# Patient Record
Sex: Female | Born: 2000 | Race: White | Hispanic: No | Marital: Single | Smoking: Never smoker
Health system: Southern US, Community
[De-identification: ages and names within clinical notes are randomized; demographics above are authoritative.]

## PROBLEM LIST (undated history)

## (undated) DIAGNOSIS — R519 Headache, unspecified: Secondary | ICD-10-CM

## (undated) DIAGNOSIS — F419 Anxiety disorder, unspecified: Secondary | ICD-10-CM

## (undated) DIAGNOSIS — R51 Headache: Secondary | ICD-10-CM

## (undated) DIAGNOSIS — S060XAA Concussion with loss of consciousness status unknown, initial encounter: Secondary | ICD-10-CM

## (undated) DIAGNOSIS — S060X9A Concussion with loss of consciousness of unspecified duration, initial encounter: Secondary | ICD-10-CM

## (undated) DIAGNOSIS — H539 Unspecified visual disturbance: Secondary | ICD-10-CM

## (undated) HISTORY — PX: EYE SURGERY: SHX253

## (undated) HISTORY — PX: TONSILECTOMY, ADENOIDECTOMY, BILATERAL MYRINGOTOMY AND TUBES: SHX2538

## (undated) HISTORY — PX: MOUTH SURGERY: SHX715

---
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
## 2022-06-24 LAB — HM PAP SMEAR: HPV, high-risk: POSITIVE

## 2022-10-22 ENCOUNTER — Ambulatory Visit: Payer: Managed Care, Other (non HMO) | Admitting: Family

## 2022-10-22 ENCOUNTER — Encounter: Payer: Self-pay | Admitting: Family

## 2022-10-22 VITALS — BP 126/76 | HR 91 | Temp 98.4°F | Resp 16 | Ht 65.1 in | Wt 194.0 lb

## 2022-10-22 DIAGNOSIS — S060X0D Concussion without loss of consciousness, subsequent encounter: Secondary | ICD-10-CM

## 2022-10-22 DIAGNOSIS — F32A Depression, unspecified: Secondary | ICD-10-CM | POA: Insufficient documentation

## 2022-10-22 DIAGNOSIS — F909 Attention-deficit hyperactivity disorder, unspecified type: Secondary | ICD-10-CM | POA: Insufficient documentation

## 2022-10-22 DIAGNOSIS — R635 Abnormal weight gain: Secondary | ICD-10-CM

## 2022-10-22 DIAGNOSIS — F419 Anxiety disorder, unspecified: Secondary | ICD-10-CM | POA: Insufficient documentation

## 2022-10-22 DIAGNOSIS — Z23 Encounter for immunization: Secondary | ICD-10-CM | POA: Diagnosis not present

## 2022-10-22 DIAGNOSIS — Z113 Encounter for screening for infections with a predominantly sexual mode of transmission: Secondary | ICD-10-CM | POA: Insufficient documentation

## 2022-10-22 DIAGNOSIS — Z0000 Encounter for general adult medical examination without abnormal findings: Secondary | ICD-10-CM | POA: Diagnosis not present

## 2022-10-22 LAB — CBC WITH DIFFERENTIAL/PLATELET
Basophils Absolute: 0 10*3/uL (ref 0.0–0.1)
Basophils Relative: 0.4 % (ref 0.0–3.0)
Eosinophils Absolute: 0.1 10*3/uL (ref 0.0–0.7)
Eosinophils Relative: 1.5 % (ref 0.0–5.0)
HCT: 44.6 % (ref 36.0–46.0)
Hemoglobin: 15 g/dL (ref 12.0–15.0)
Lymphocytes Relative: 42.6 % (ref 12.0–46.0)
Lymphs Abs: 2.4 10*3/uL (ref 0.7–4.0)
MCHC: 33.6 g/dL (ref 30.0–36.0)
MCV: 90.2 fl (ref 78.0–100.0)
Monocytes Absolute: 0.6 10*3/uL (ref 0.1–1.0)
Monocytes Relative: 11 % (ref 3.0–12.0)
Neutro Abs: 2.5 10*3/uL (ref 1.4–7.7)
Neutrophils Relative %: 44.5 % (ref 43.0–77.0)
Platelets: 337 10*3/uL (ref 150.0–400.0)
RBC: 4.94 Mil/uL (ref 3.87–5.11)
RDW: 13.2 % (ref 11.5–15.5)
WBC: 5.6 10*3/uL (ref 4.0–10.5)

## 2022-10-22 LAB — COMPREHENSIVE METABOLIC PANEL
ALT: 20 U/L (ref 0–35)
AST: 20 U/L (ref 0–37)
Albumin: 4.9 g/dL (ref 3.5–5.2)
Alkaline Phosphatase: 92 U/L (ref 39–117)
BUN: 17 mg/dL (ref 6–23)
CO2: 28 mEq/L (ref 19–32)
Calcium: 9.8 mg/dL (ref 8.4–10.5)
Chloride: 101 mEq/L (ref 96–112)
Creatinine, Ser: 0.96 mg/dL (ref 0.40–1.20)
GFR: 84.66 mL/min (ref >60.00)
Glucose, Bld: 87 mg/dL (ref 70–99)
Potassium: 3.8 mEq/L (ref 3.5–5.1)
Sodium: 138 mEq/L (ref 135–145)
Total Bilirubin: 0.4 mg/dL (ref 0.2–1.2)
Total Protein: 8.1 g/dL (ref 6.0–8.3)

## 2022-10-22 LAB — TSH: TSH: 3.54 u[IU]/mL (ref 0.35–5.50)

## 2022-10-22 LAB — LIPID PANEL
Cholesterol: 127 mg/dL (ref 0–200)
HDL: 57.1 mg/dL (ref >39.00)
LDL Cholesterol: 57 mg/dL (ref 0–99)
NonHDL: 69.87
Total CHOL/HDL Ratio: 2
Triglycerides: 63 mg/dL (ref 0.0–149.0)
VLDL: 12.6 mg/dL (ref 0.0–40.0)

## 2022-10-22 NOTE — Assessment & Plan Note (Signed)
She is followed by [facility].  Adderall caused anorexia.  She feels like her focus is "OK" currently on Wellbutrin.  She will follow back with them in 3 months.

## 2022-10-22 NOTE — Assessment & Plan Note (Addendum)
Discussed healthy diet, exercise and weight loss.  Vision/dental up to date. Pap is up to date. Flu shot today. Encouraged her to get her covid booster at her pharmacy.  Immunizations reviewed in [facility] and are up to date otherwise.

## 2022-10-22 NOTE — Assessment & Plan Note (Addendum)
Wt Readings from Last 3 Encounters:  10/22/22 194 lb (88 kg)  06/29/19 142 lb (64.4 kg) (77 %, Z= 0.75)*  06/08/19 143 lb (64.9 kg) (78 %, Z= 0.79)*   * Growth percentiles are based on CDC (Girls, 2-20 Years) data.   She took her last dose of sertraline last week. She was on sertraline for about 1 year.  She believes that she weight was about 160 a year ago. We discussed meal prepping, limiting fast food, regular exercise. Obtain TSH.

## 2022-10-22 NOTE — Assessment & Plan Note (Signed)
>>  ASSESSMENT AND PLAN FOR ANXIETY WRITTEN ON 10/22/2022 11:25 AM BY O'SULLIVAN, Dolan Xia, NP  Currently stable off of sertraline. Will continue to monitor

## 2022-10-22 NOTE — Progress Notes (Signed)
Subjective:   By signing my name below, I, Anne [name], attest that this documentation has been prepared under the direction and in the presence of Anne Alar, NP. 10/22/2022   Patient ID: Anne [name], female    DOB: 01-Mar-2001, 22 y.o.   MRN: [id number]  Chief Complaint  Patient presents with   New Patient (Initial Visit)    HPI Patient is in today for a new patient visit.   Allergies: She has congestion from her allergies at this time.   Weight: She has gained weight his past year. She has stopped taking sertraline last week due to noticing she was gaining weight once she started taking it. She has been taking sertraline for the past year. She weighed around 150-160 last year prior to taking sertraline. She is also in college and is eating more fast food frequently. She also notes her weight gain may be from recently starting a new healthy relationship.  Wt Readings from Last 3 Encounters:  10/22/22 194 lb (88 kg)  06/29/19 142 lb (64.4 kg) (77 %, Z= 0.75)*  06/08/19 143 lb (64.9 kg) (78 %, Z= 0.79)*   * Growth percentiles are based on CDC (Girls, 2-20 Years) data.   Exercise: She was participating in regular exercise by doing a burn boot camp program from May 2023-October 2023 but noticed she was gaining weight during that time.  Anxiety: Her anxiety is stable at this time.   Wellbutrin: She was recently started on Wellbutrin by her ADHD specialist. She was on Adderall in he past but stopped due to decreased appetite. She has a follow up appointment with her specialist in 3 months.   UTI: She recently developed a UTI back to back and seen her GYN specialist to manage it. She typically gets UTI after intercourse.   Acute: She denies having any unexpected weight change, ear pain, hearing loss and rhinorrhea, visual disturbance, cough, chest pain and leg swelling, nausea, vomiting, diarrhea, constipation, blood in stool, or dysuria and frequency, for myalgias and  arthralgias, rash, headaches, adenopathy, current concerns of depression or anxiety at this time.  Social history: She graduated last week. She is planning on starting work soon. She does not use alcohol or drugs. She is on birthcontrol pills and has one female partner. She does not use tobacco or vaping products.   Medical history: She has two concussions in the past that were caused while playing softball. She had no long term effects from either concussion. She is allergic to sulfa.   Surgical history: She had her tonsils removed. 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 She had her wisdom teeth removed around age 38.   Family history: Her other has a history of seizures. Her paternal mother has a history of diabetes. Her paternal grandfather passed from alzheimer's and dementia.   Immunizations: She does not have the flu vaccine and is interested in receiving the flu vaccine. She does not have any Covid-19 booster vaccine.   Dental: She is UTD on dental care.   Pap smear: She is UTD on pap smear and receives them regularly at her GYN office.    Past Medical History:  Diagnosis Date   Anxiety    Concussion    11/2017,  2017   Headache    Strain of hip flexor 06/08/2019   Vision abnormalities     Past Surgical History:  Procedure Laterality Date   EYE SURGERY     MOUTH SURGERY  wisdom tooth extraction age 10   STRABISMUS SURGERY Right 10/08/2018   Procedure: RIGHT EYE STRABISMUS REPAIR PEDIATRIC;  Surgeon: Everitt Amber, MD;  Location: [place];  Service: Ophthalmology;  Laterality: Right;   TONSILECTOMY, ADENOIDECTOMY, BILATERAL MYRINGOTOMY AND TUBES      Family History  Problem Relation Age of Onset   Epilepsy Mother    Diabetes Paternal Grandmother    Alzheimer's disease Paternal Grandfather    Sudden death Neg Hx    Hypertension Neg Hx    Hyperlipidemia Neg Hx    Heart attack Neg Hx     Social History   Socioeconomic History    Marital status: Single    Spouse name: Not on file   Number of children: Not on file   Years of education: Not on file   Highest education level: Not on file  Occupational History   Not on file  Tobacco Use   Smoking status: Never   Smokeless tobacco: Never  Vaping Use   Vaping Use: Never used  Substance and Sexual Activity   Alcohol use: No    Alcohol/week: 0.0 standard drinks of alcohol   Drug use: No   Sexual activity: Yes    Partners: Male    Birth control/protection: Pill  Other Topics Concern   Not on file  Social History Narrative   Graduated from [employer], 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   She has a cat and a dog.    Social Determinants of Health   Financial Resource Strain: Not on file  Food Insecurity: Not on file  Transportation Needs: Not on file  Physical Activity: Sufficiently Active (10/22/2022)   Exercise Vital Sign    Days of Exercise per Week: 4 days    Minutes of Exercise per Session: 50 min  Stress: Not on file  Social Connections: Not on file  Intimate Partner Violence: Not on file    Outpatient Medications Prior to Visit  Medication Sig Dispense Refill   buPROPion (WELLBUTRIN XL) 150 MG 24 hr tablet Take 150 mg by mouth daily.     Levonorgestrel-Ethinyl Estradiol (AMETHIA,CAMRESE) 0.1-0.02 & 0.01 MG tablet Take 1 tablet by mouth daily.  3   No facility-administered medications prior to visit.    Allergies  Allergen Reactions   Sulfa Antibiotics Hives and Rash    ROS See HPI    Objective:    Physical Exam Constitutional:      General: She is not in acute distress.    Appearance: Normal appearance. She is not ill-appearing.  HENT:     Head: Normocephalic and atraumatic.     Right Ear: Tympanic membrane, ear canal and external ear normal.     Left Ear: Tympanic membrane, ear canal and external ear normal.  Eyes:     Extraocular Movements:  Extraocular movements intact.     Right eye: No nystagmus.     Left eye: No nystagmus.     Pupils: Pupils are equal, round, and reactive to light.  Neck:     Thyroid: No thyroid mass, thyromegaly or thyroid tenderness.  Cardiovascular:     Rate and Rhythm: Normal rate and regular rhythm.     Heart sounds: Normal heart sounds. No murmur heard.    No gallop.  Pulmonary:     Effort: Pulmonary effort is normal. No respiratory distress.     Breath sounds: Normal breath sounds.  No wheezing or rales.  Abdominal:     General: Bowel sounds are normal. There is no distension.     Palpations: Abdomen is soft.     Tenderness: There is no abdominal tenderness. There is no guarding.  Musculoskeletal:     Comments: 5/5 strength in both upper and lower extremities  Lymphadenopathy:     Cervical: No cervical adenopathy.  Skin:    General: Skin is warm and dry.  Neurological:     Mental Status: She is alert and oriented to person, place, and time.     Deep Tendon Reflexes:     Reflex Scores:      Patellar reflexes are 2+ on the right side and 2+ on the left side. Psychiatric:        Judgment: Judgment normal.     BP 126/76 (BP Location: Right Arm, Patient Position: Sitting, Cuff Size: Small)   Pulse 91   Temp 98.4 F (36.9 C) (Oral)   Resp 16   Ht 5' 5.1" (1.654 m)   Wt 194 lb (88 kg)   SpO2 99%   BMI 32.18 kg/m  Wt Readings from Last 3 Encounters:  10/22/22 194 lb (88 kg)  06/29/19 142 lb (64.4 kg) (77 %, Z= 0.75)*  06/08/19 143 lb (64.9 kg) (78 %, Z= 0.79)*   * Growth percentiles are based on CDC (Girls, 2-20 Years) data.       Assessment & Plan:  Preventative health care Assessment & Plan: Discussed healthy diet, exercise and weight loss.  Vision/dental up to date. Pap is up to date. Flu shot today. Encouraged her to get her covid booster at her pharmacy.  Immunizations reviewed in [facility] and are up to date otherwise.   Orders: -     Comprehensive metabolic panel -     CBC  with Differential/Platelet -     Lipid panel  Weight gain Assessment & Plan: Wt Readings from Last 3 Encounters:  10/22/22 194 lb (88 kg)  06/29/19 142 lb (64.4 kg) (77 %, Z= 0.75)*  06/08/19 143 lb (64.9 kg) (78 %, Z= 0.79)*   * Growth percentiles are based on CDC (Girls, 2-20 Years) data.   She took her last dose of sertraline last week. She was on sertraline for about 1 year.  She believes that she weight was about 160 a year ago. We discussed meal prepping, limiting fast food, regular exercise. Obtain TSH.     Orders: -     TSH  Anxiety Assessment & Plan: Currently stable off of sertraline. Will continue to monitor    Attention deficit hyperactivity disorder (ADHD), unspecified ADHD type Assessment & Plan: She is followed by [facility].  Adderall caused anorexia.  She feels like her focus is "OK" currently on Wellbutrin.  She will follow back with them in 3 months.     Concussion without loss of consciousness, subsequent encounter Assessment & Plan: Reports no residual issues from previous concussions.    Needs flu shot -     Flu Vaccine QUAD 10mo+IM (Fluarix, Fluzone & Alfiuria Quad PF)    I, Nance Pear, NP, personally preformed the services described in this documentation.  All medical record entries made by the scribe were at my direction and in my presence.  I have reviewed the chart and discharge instructions (if applicable) and agree that the record reflects my personal performance and is accurate and complete. 10/22/2022   I,Anne [name],acting as a scribe for Nance Pear, NP.,have  documented all relevant documentation on the behalf of Nance Pear, NP,as directed by  Nance Pear, NP while in the presence of Nance Pear, NP.   Nance Pear, NP

## 2022-10-22 NOTE — Assessment & Plan Note (Signed)
Reports no residual issues from previous concussions.

## 2022-10-22 NOTE — Assessment & Plan Note (Signed)
Currently stable off of sertraline. Will continue to monitor

## 2022-11-05 ENCOUNTER — Encounter: Payer: Self-pay | Admitting: Family

## 2022-11-07 MED ORDER — ESCITALOPRAM OXALATE 10 MG PO TABS: ORAL_TABLET | ORAL | 0 refills | Status: DC

## 2022-11-30 ENCOUNTER — Other Ambulatory Visit: Payer: Self-pay | Admitting: Family

## 2022-12-22 ENCOUNTER — Encounter: Payer: Self-pay | Admitting: Family

## 2022-12-22 ENCOUNTER — Ambulatory Visit: Payer: Managed Care, Other (non HMO) | Admitting: Family

## 2022-12-22 VITALS — BP 102/51 | HR 80 | Temp 97.7°F | Resp 16 | Wt 199.0 lb

## 2022-12-22 DIAGNOSIS — B001 Herpesviral vesicular dermatitis: Secondary | ICD-10-CM | POA: Diagnosis not present

## 2022-12-22 DIAGNOSIS — F909 Attention-deficit hyperactivity disorder, unspecified type: Secondary | ICD-10-CM

## 2022-12-22 DIAGNOSIS — F419 Anxiety disorder, unspecified: Secondary | ICD-10-CM | POA: Diagnosis not present

## 2022-12-22 DIAGNOSIS — F32A Depression, unspecified: Secondary | ICD-10-CM | POA: Diagnosis not present

## 2022-12-22 MED ORDER — VALACYCLOVIR HCL 500 MG PO TABS: 500.0000 mg | ORAL_TABLET | Freq: Every day | ORAL | 1 refills | Status: DC

## 2022-12-22 NOTE — Assessment & Plan Note (Signed)
Uncontrolled.  She is definitely having some situational stress living at home with her mother who is "very negative."  She plans to move out in the next few months. I think that a new environment will be helpful for her.  I have advised her to schedule an appointment with a counselor and also recommended that she schedule an appointment with psychiatry for med management. She has not had significant improvement in her symptoms on sertraline, wellbutrin or lexapro.  We discussed if worsening symptoms to go to the ER.

## 2022-12-22 NOTE — Progress Notes (Signed)
Subjective:   By signing my name below, I, Anne [name], attest that this documentation has been prepared under the direction and in the presence of Anne Craze, NP. 12/22/2022   Patient ID: Anne [name], female    DOB: 2001-08-03, 22 y.o.   MRN: [id number]  Chief Complaint  Patient presents with   Anxiety    Follow up and discuss medication     HPI Patient is in today for an office visit.   Lexapro: She took 10 mg Lexapro for 3 weeks, but did not find relief with it. She was having difficulty with thinking of the worst outcome to situations while on it. She has not been taking it for the past week. After stopping Lexapro, she feels as though she no longer enjoys her normal activities as much and stresses  out over problems that are not hers. She also lives at home and finds it difficult for her mood because her mother is a source of stress. She plans to move out in the next few months. Denies SI.   Wellbutrin: She has not been taking Wellbutrin for the past month. She reports that her depression was no better while taking it. This was being rx'd by [facility] for her [facility].   Counseling: She is interested in counseling.  Blister: She received a blister from the sun recently on her lip and is interested in starting medicine to prevent them from popping up in the future. Reports that they are recurrent in nature.     Past Medical History:  Diagnosis Date   Anxiety    Concussion    11/2017,  2017   Headache    Strain of hip flexor 06/08/2019   Vision abnormalities     Past Surgical History:  Procedure Laterality Date   EYE SURGERY     MOUTH SURGERY     wisdom tooth extraction age 21   STRABISMUS SURGERY Right 10/08/2018   Procedure: RIGHT EYE STRABISMUS REPAIR PEDIATRIC;  Surgeon: Anne Carrow, MD;  Location: [facility];  Service: Ophthalmology;  Laterality: Right;   TONSILECTOMY, ADENOIDECTOMY, BILATERAL MYRINGOTOMY AND TUBES       Family History  Problem Relation Age of Onset   Epilepsy Mother    Diabetes Paternal Grandmother    Alzheimer's disease Paternal Grandfather    Sudden death Neg Hx    Hypertension Neg Hx    Hyperlipidemia Neg Hx    Heart attack Neg Hx     Social History   Socioeconomic History   Marital status: Single    Spouse name: Not on file   Number of children: Not on file   Years of education: Not on file   Highest education level: Not on file  Occupational History   Not on file  Tobacco Use   Smoking status: Never   Smokeless tobacco: Never  Vaping Use   Vaping Use: Never used  Substance and Sexual Activity   Alcohol use: No    Alcohol/week: 0.0 standard drinks of alcohol   Drug use: No   Sexual activity: Yes    Partners: Male    Birth control/protection: Pill  Other Topics Concern   Not on file  Social History Narrative   Graduated from [employer], she is job Pharmacist, 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   She has a cat and a dog.    Social Determinants of Health   Financial  Resource Strain: Not on file  Food Insecurity: Not on file  Transportation Needs: Not on file  Physical Activity: Sufficiently Active (10/22/2022)   Exercise Vital Sign    Days of Exercise per Week: 4 days    Minutes of Exercise per Session: 50 min  Stress: Not on file  Social Connections: Not on file  Intimate Partner Violence: Not on file    Outpatient Medications Prior to Visit  Medication Sig Dispense Refill   Levonorgestrel-Ethinyl Estradiol (AMETHIA,CAMRESE) 0.1-0.02 & 0.01 MG tablet Take 1 tablet by mouth daily.  3   buPROPion (WELLBUTRIN XL) 150 MG 24 hr tablet Take 150 mg by mouth daily.     escitalopram (LEXAPRO) 10 MG tablet Take 1 tablet by mouth daily. (Patient not taking: Reported on 12/22/2022) 90 tablet 1   No facility-administered medications prior to visit.    Allergies  Allergen Reactions   Sulfa  Antibiotics Hives and Rash    ROS See HPI      Objective:    Physical Exam Constitutional:      General: She is not in acute distress.    Appearance: Normal appearance.  HENT:     Head: Normocephalic and atraumatic.     Comments: Cold sore right lower right lip    Right Ear: External ear normal.     Left Ear: External ear normal.  Eyes:     Extraocular Movements: Extraocular movements intact.     Pupils: Pupils are equal, round, and reactive to light.  Cardiovascular:     Rate and Rhythm: Normal rate.  Pulmonary:     Effort: Pulmonary effort is normal.  Skin:    General: Skin is warm.  Neurological:     Mental Status: She is alert and oriented to person, place, and time.  Psychiatric:        Attention and Perception: Attention normal.        Mood and Affect: Affect normal.        Speech: Speech normal.        Behavior: Behavior normal. Behavior is cooperative.        Thought Content: Thought content normal.        Judgment: Judgment normal.     Comments: Mildly anxious     BP (!) 102/51 (BP Location: Right Arm, Patient Position: Sitting, Cuff Size: Large)   Pulse 80   Temp 97.7 F (36.5 C) (Oral)   Resp 16   Wt 199 lb (90.3 kg)   SpO2 100%   BMI 33.01 kg/m  Wt Readings from Last 3 Encounters:  12/22/22 199 lb (90.3 kg)  10/22/22 194 lb (88 kg)  06/29/19 142 lb (64.4 kg) (77 %, Z= 0.75)*   * Growth percentiles are based on CDC (Girls, 2-20 Years) data.       Assessment & Plan:  Anxiety and depression Assessment & Plan: Uncontrolled.  She is definitely having some situational stress living at home with her mother who is "very negative."  She plans to move out in the next few months. I think that a new environment will be helpful for her.  I have advised her to schedule an appointment with a counselor and also recommended that she schedule an appointment with psychiatry for med management. She has not had significant improvement in her symptoms on sertraline,  wellbutrin or lexapro.  We discussed if worsening symptoms to go to the ER.     Attention deficit hyperactivity disorder (ADHD), unspecified ADHD type Assessment & Plan: Uncontrolled.  Following at [facility].  Perhaps her new psychiatrist could further manage her ADHD.    Recurrent cold sores Assessment & Plan: Uncontrolled. Will initiate valtrex suppressive therapy 500mg  once daily.    Other orders -     valACYclovir HCl; Take 1 tablet (500 mg total) by mouth daily.  Dispense: 90 tablet; Refill: 1    I, Lemont Fillers, NP, personally preformed the services described in this documentation.  All medical record entries made by the scribe were at my direction and in my presence.  I have reviewed the chart and discharge instructions (if applicable) and agree that the record reflects my personal performance and is accurate and complete. 12/22/2022  Lemont Fillers, NP   [facility] as a scribe for Lemont Fillers, NP.,have documented all relevant documentation on the behalf of Lemont Fillers, NP,as directed by  Lemont Fillers, NP while in the presence of Lemont Fillers, NP.

## 2022-12-22 NOTE — Patient Instructions (Addendum)
Psychiatric Services:  [place] [facility] [address] [place] [address]  [phone] [facility] [place]) - [phone] Dr. Milagros Evener [place]) [phone] [facility] [place]) [phone] [facility] & [place]) [phone]

## 2022-12-22 NOTE — Assessment & Plan Note (Signed)
Uncontrolled. Will initiate valtrex suppressive therapy 500mg  once daily.

## 2022-12-22 NOTE — Assessment & Plan Note (Signed)
Uncontrolled.  Following at [facility].  Perhaps her new psychiatrist could further manage her ADHD.

## 2022-12-24 ENCOUNTER — Other Ambulatory Visit: Payer: Self-pay | Admitting: Family

## 2022-12-24 MED ORDER — DULOXETINE HCL 60 MG PO CPEP: 60.0000 mg | ORAL_CAPSULE | Freq: Every day | ORAL | 0 refills | Status: DC

## 2022-12-24 MED ORDER — DULOXETINE HCL 30 MG PO CPEP: ORAL_CAPSULE | ORAL | 0 refills | Status: DC

## 2022-12-29 ENCOUNTER — Encounter: Payer: Self-pay | Admitting: *Deleted

## 2022-12-29 ENCOUNTER — Ambulatory Visit: Payer: Managed Care, Other (non HMO) | Admitting: Family

## 2023-01-01 ENCOUNTER — Telehealth: Payer: Managed Care, Other (non HMO)

## 2023-01-03 ENCOUNTER — Encounter: Payer: Managed Care, Other (non HMO) | Admitting: Nurse Practitioner

## 2023-01-03 DIAGNOSIS — R399 Unspecified symptoms and signs involving the genitourinary system: Secondary | ICD-10-CM

## 2023-01-03 NOTE — Progress Notes (Signed)
Signed on at 848am prior to her appt. I was with another video visit at that time. She left before 9am and did not log back in.

## 2023-01-04 ENCOUNTER — Encounter: Payer: Self-pay | Admitting: Family

## 2023-01-05 NOTE — Progress Notes (Signed)
Subjective:   By signing my name below, I, Carlena Bjornstad, attest that this documentation has been prepared under the direction and in the presence of [name], NP.  01/06/2023.   Patient ID: Anne [name], female    DOB: 2000/12/03, 22 y.o.   MRN: [id number]  No chief complaint on file.   HPI Patient is in today for an office visit.  UTI:  On 01/04/23 she messaged the office with concerns for a UTI. For treatment she had been trying to take AZO which seems to be ineffective. Today, she complains of burning sensations with urination, urinary frequency, and urgency. Onset of her symptoms was last week, about Wednesday or Thursday.    Past Medical History:  Diagnosis Date   Anxiety    Concussion    11/2017,  2017   Headache    Strain of hip flexor 06/08/2019   Vision abnormalities     Past Surgical History:  Procedure Laterality Date   EYE SURGERY     MOUTH SURGERY     wisdom tooth extraction age 37   STRABISMUS SURGERY Right 10/08/2018   Procedure: RIGHT EYE STRABISMUS REPAIR PEDIATRIC;  Surgeon: Verne Carrow, MD;  Location: [facility];  Service: Ophthalmology;  Laterality: Right;   TONSILECTOMY, ADENOIDECTOMY, BILATERAL MYRINGOTOMY AND TUBES      Family History  Problem Relation Age of Onset   Epilepsy Mother    Diabetes Paternal Grandmother    Alzheimer's disease Paternal Grandfather    Sudden death Neg Hx    Hypertension Neg Hx    Hyperlipidemia Neg Hx    Heart attack Neg Hx     Social History   Socioeconomic History   Marital status: Single    Spouse name: Not on file   Number of children: Not on file   Years of education: Not on file   Highest education level: Not on file  Occupational History   Not on file  Tobacco Use   Smoking status: Never   Smokeless tobacco: Never  Vaping Use   Vaping Use: Never used  Substance and Sexual Activity   Alcohol use: No    Alcohol/week: 0.0 standard drinks of alcohol   Drug use: No    Sexual activity: Yes    Partners: Male    Birth control/protection: Pill  Other Topics Concern   Not on file  Social History Narrative   Graduated from [employer], she is job Pharmacist, 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   She has a cat and a dog.    Social Determinants of Health   Financial Resource Strain: Not on file  Food Insecurity: Not on file  Transportation Needs: Not on file  Physical Activity: Sufficiently Active (10/22/2022)   Exercise Vital Sign    Days of Exercise per Week: 4 days    Minutes of Exercise per Session: 50 min  Stress: Not on file  Social Connections: Not on file  Intimate Partner Violence: Not on file    Outpatient Medications Prior to Visit  Medication Sig Dispense Refill   DULoxetine (CYMBALTA) 30 MG capsule TAKE 1 CAPSULE BY MOUTH DAILY FOR 1 WEEK, THEN INCREASE TO a 60mg  tab once weekly. 7 capsule 0   DULoxetine (CYMBALTA) 60 MG capsule Take 1 capsule (60 mg total) by mouth daily. 30 capsule 0   Levonorgestrel-Ethinyl Estradiol (AMETHIA,CAMRESE) 0.1-0.02 & 0.01 MG tablet Take 1 tablet by mouth daily.  3   valACYclovir (VALTREX) 500 MG tablet Take 1 tablet (500 mg total) by mouth daily. 90 tablet 1   No facility-administered medications prior to visit.    Allergies  Allergen Reactions   Sulfa Antibiotics Hives and Rash    Review of Systems  Genitourinary:  Positive for dysuria, frequency and urgency. Negative for flank pain and hematuria.    See HPI.     Objective:    Physical Exam Constitutional:      Appearance: Normal appearance.  HENT:     Head: Normocephalic and atraumatic.     Right Ear: Tympanic membrane, ear canal and external ear normal.     Left Ear: Tympanic membrane, ear canal and external ear normal.  Eyes:     Extraocular Movements: Extraocular movements intact.     Pupils: Pupils are equal, round, and reactive to light.  Cardiovascular:     Rate and  Rhythm: Normal rate and regular rhythm.     Heart sounds: Normal heart sounds. No murmur heard.    No gallop.  Pulmonary:     Effort: Pulmonary effort is normal. No respiratory distress.     Breath sounds: Normal breath sounds. No wheezing or rales.  Abdominal:     Tenderness: There is abdominal tenderness in the suprapubic area. There is no right CVA tenderness or left CVA tenderness.  Skin:    General: Skin is warm and dry.  Neurological:     General: No focal deficit present.     Mental Status: She is alert and oriented to person, place, and time.  Psychiatric:        Mood and Affect: Mood normal.        Behavior: Behavior normal.     BP (!) 112/56 (BP Location: Left Arm, Patient Position: Sitting, Cuff Size: Large)   Pulse 79   Temp 98 F (36.7 C) (Oral)   Resp 16   Wt 196 lb (88.9 kg)   SpO2 100%   BMI 32.52 kg/m  Wt Readings from Last 3 Encounters:  01/06/23 196 lb (88.9 kg)  12/22/22 199 lb (90.3 kg)  10/22/22 194 lb (88 kg)      Assessment & Plan:   Problem List Items Addressed This Visit       Unprioritized   UTI (urinary tract infection)    New. Will send for culture and begin treatment with macrobid. Pt is advised to call if symptoms worsen or if not improved in 2-3 days.      Relevant Medications   nitrofurantoin, macrocrystal-monohydrate, (MACROBID) 100 MG capsule   Other Visit Diagnoses     Dysuria    -  Primary   Relevant Orders   POCT Urinalysis Dipstick (Automated) (Completed)   Urine Culture        Meds ordered this encounter  Medications   nitrofurantoin, macrocrystal-monohydrate, (MACROBID) 100 MG capsule    Sig: Take 1 capsule (100 mg total) by mouth 2 (two) times daily for 5 days.    Dispense:  10 capsule    Refill:  0    Order Specific Question:   Supervising Provider    Answer:   Danise Edge A [[id number]]    I, Lemont Fillers, NP, personally preformed the services described in this documentation.  All medical record  entries made by the scribe were at my direction and in my presence.  I have reviewed the chart and discharge instructions (if applicable) and agree that the record reflects my personal performance and  is accurate and complete. 01/06/2023.  I,Mathew Stumpf,acting as a Neurosurgeon for [employer], NP.,have documented all relevant documentation on the behalf of Lemont Fillers, NP,as directed by  Lemont Fillers, NP while in the presence of Lemont Fillers, NP.   Lemont Fillers, NP

## 2023-01-06 ENCOUNTER — Ambulatory Visit: Payer: Managed Care, Other (non HMO) | Admitting: Family

## 2023-01-06 ENCOUNTER — Other Ambulatory Visit (HOSPITAL_BASED_OUTPATIENT_CLINIC_OR_DEPARTMENT_OTHER): Payer: Self-pay

## 2023-01-06 VITALS — BP 112/56 | HR 79 | Temp 98.0°F | Resp 16 | Wt 196.0 lb

## 2023-01-06 DIAGNOSIS — R300 Dysuria: Secondary | ICD-10-CM

## 2023-01-06 DIAGNOSIS — N390 Urinary tract infection, site not specified: Secondary | ICD-10-CM | POA: Insufficient documentation

## 2023-01-06 DIAGNOSIS — N3000 Acute cystitis without hematuria: Secondary | ICD-10-CM

## 2023-01-06 LAB — POC URINALSYSI DIPSTICK (AUTOMATED)
Bilirubin, UA: NEGATIVE
Blood, UA: NEGATIVE
Glucose, UA: NEGATIVE
Ketones, UA: NEGATIVE
Nitrite, UA: NEGATIVE
Protein, UA: NEGATIVE
Spec Grav, UA: 1.015 (ref 1.010–1.025)
Urobilinogen, UA: 0.2 E.U./dL
pH, UA: 7 (ref 5.0–8.0)

## 2023-01-06 MED ORDER — NITROFURANTOIN MONOHYD MACRO 100 MG PO CAPS
100.0000 mg | ORAL_CAPSULE | Freq: Two times a day (BID) | ORAL | 0 refills | Status: AC
  Filled 2023-01-06: qty 10, 5d supply, fill #0

## 2023-01-06 NOTE — Assessment & Plan Note (Signed)
New. Will send for culture and begin treatment with macrobid. Pt is advised to call if symptoms worsen or if not improved in 2-3 days.

## 2023-01-07 LAB — URINE CULTURE
MICRO NUMBER:: 14861857
SPECIMEN QUALITY:: ADEQUATE

## 2023-01-08 ENCOUNTER — Telehealth: Payer: Self-pay | Admitting: Family

## 2023-01-08 NOTE — Telephone Encounter (Signed)
See mychart.  

## 2023-01-12 ENCOUNTER — Ambulatory Visit: Payer: Managed Care, Other (non HMO) | Admitting: Family

## 2023-01-12 ENCOUNTER — Other Ambulatory Visit (HOSPITAL_COMMUNITY)
Admission: RE | Admit: 2023-01-12 | Discharge: 2023-01-12 | Disposition: A | Discharge status: Home / Self Care | Payer: Managed Care, Other (non HMO) | Source: Ambulatory Visit | Attending: Family | Admitting: Family

## 2023-01-12 VITALS — BP 104/59 | HR 80 | Temp 98.3°F | Resp 16 | Wt 196.0 lb

## 2023-01-12 DIAGNOSIS — R350 Frequency of micturition: Secondary | ICD-10-CM | POA: Insufficient documentation

## 2023-01-12 DIAGNOSIS — B079 Viral wart, unspecified: Secondary | ICD-10-CM

## 2023-01-12 DIAGNOSIS — N760 Acute vaginitis: Secondary | ICD-10-CM

## 2023-01-12 DIAGNOSIS — Z113 Encounter for screening for infections with a predominantly sexual mode of transmission: Secondary | ICD-10-CM

## 2023-01-12 DIAGNOSIS — B9689 Other specified bacterial agents as the cause of diseases classified elsewhere: Secondary | ICD-10-CM

## 2023-01-12 DIAGNOSIS — A630 Anogenital (venereal) warts: Secondary | ICD-10-CM | POA: Insufficient documentation

## 2023-01-12 MED ORDER — IMIQUIMOD 5 % EX CREA: TOPICAL_CREAM | CUTANEOUS | 1 refills | Status: AC

## 2023-01-12 NOTE — Assessment & Plan Note (Signed)
New. Will rx with aldara 3x a week.

## 2023-01-12 NOTE — Assessment & Plan Note (Signed)
Vaginal swab obtained. Will send for GC/Chlamydia, trichomonas, BV (at pt request).

## 2023-01-12 NOTE — Progress Notes (Addendum)
Subjective:     Patient ID: Anne [name], female    DOB: 2001-01-10, 22 y.o.   MRN: [id number]  Chief Complaint  Patient presents with   STI screening    Complains of some vaginal discharge    HPI Patient is in today for STI testing.  Last visit she complained of dysuria.  We treated her empirically with macrobid. Urine culture grew mixed genital flora.  She reported on 4/25 that her symptoms had improved some but she was still having urinary frequency. She reports that she was still having urinary frequency. We asked her to return for vaginal swab for STI testing.    She reports that she completed the full course of antibiotics since she last reached out and her urinary frequency is now resolved.    Health Maintenance Due  Topic Date Due   COVID-19 Vaccine (1) Never done   HIV Screening  Never done   Hepatitis C Screening  Never done    Past Medical History:  Diagnosis Date   Anxiety    Concussion    11/2017,  2017   Headache    Strain of hip flexor 06/08/2019   Vision abnormalities     Past Surgical History:  Procedure Laterality Date   EYE SURGERY     MOUTH SURGERY     wisdom tooth extraction age 33   STRABISMUS SURGERY Right 10/08/2018   Procedure: RIGHT EYE STRABISMUS REPAIR PEDIATRIC;  Surgeon: Verne Carrow, MD;  Location: [facility];  Service: Ophthalmology;  Laterality: Right;   TONSILECTOMY, ADENOIDECTOMY, BILATERAL MYRINGOTOMY AND TUBES      Family History  Problem Relation Age of Onset   Epilepsy Mother    Diabetes Paternal Grandmother    Alzheimer's disease Paternal Grandfather    Sudden death Neg Hx    Hypertension Neg Hx    Hyperlipidemia Neg Hx    Heart attack Neg Hx     Social History   Socioeconomic History   Marital status: Single    Spouse name: Not on file   Number of children: Not on file   Years of education: Not on file   Highest education level: Not on file  Occupational History   Not on file  Tobacco Use    Smoking status: Never   Smokeless tobacco: Never  Vaping Use   Vaping Use: Never used  Substance and Sexual Activity   Alcohol use: No    Alcohol/week: 0.0 standard drinks of alcohol   Drug use: No   Sexual activity: Yes    Partners: Male    Birth control/protection: Pill  Other Topics Concern   Not on file  Social History Narrative   Graduated from [employer], she is job Pharmacist, 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   She has a cat and a dog.    Social Determinants of Health   Financial Resource Strain: Not on file  Food Insecurity: Not on file  Transportation Needs: Not on file  Physical Activity: Sufficiently Active (10/22/2022)   Exercise Vital Sign    Days of Exercise per Week: 4 days    Minutes of Exercise per Session: 50 min  Stress: Not on file  Social Connections: Not on file  Intimate Partner Violence: Not on file    Outpatient Medications Prior to Visit  Medication Sig Dispense Refill   DULoxetine (CYMBALTA) 30 MG capsule TAKE 1 CAPSULE BY MOUTH DAILY FOR  1 WEEK, THEN INCREASE TO a 60mg  tab once weekly. 7 capsule 0   DULoxetine (CYMBALTA) 60 MG capsule Take 1 capsule (60 mg total) by mouth daily. 30 capsule 0   Levonorgestrel-Ethinyl Estradiol (AMETHIA,CAMRESE) 0.1-0.02 & 0.01 MG tablet Take 1 tablet by mouth daily.  3   valACYclovir (VALTREX) 500 MG tablet Take 1 tablet (500 mg total) by mouth daily. 90 tablet 1   No facility-administered medications prior to visit.    Allergies  Allergen Reactions   Sulfa Antibiotics Hives and Rash    ROS     Objective:    Physical Exam Constitutional:      Appearance: Normal appearance.  HENT:     Head: Normocephalic and atraumatic.  Genitourinary:    General: Normal vulva.       Comments: Several warts noted within both areas marked above.   Neurological:     Mental Status: She is alert.     BP (!) 104/59 (BP Location: Right Arm, Patient  Position: Sitting, Cuff Size: Large)   Pulse 80   Temp 98.3 F (36.8 C) (Oral)   Resp 16   Wt 196 lb (88.9 kg)   SpO2 100%   BMI 32.52 kg/m  Wt Readings from Last 3 Encounters:  01/12/23 196 lb (88.9 kg)  01/06/23 196 lb (88.9 kg)  12/22/22 199 lb (90.3 kg)       Assessment & Plan:   Problem List Items Addressed This Visit       Unprioritized   Viral warts    New. Will rx with aldara 3x a week.       Relevant Medications   imiquimod (ALDARA) 5 % cream   Urinary frequency - Primary    Resolved.        Relevant Orders   Cervicovaginal ancillary only( [place])   Screening examination for STI    Vaginal swab obtained. Will send for GC/Chlamydia, trichomonas, BV (at pt request).       Addendum: Swab + for bacterial vaginosis  I am having Anne [name] start on imiquimod. I am also having her maintain her Levonorgestrel-Ethinyl Estradiol, valACYclovir, DULoxetine, and DULoxetine.  Meds ordered this encounter  Medications   imiquimod (ALDARA) 5 % cream    Sig: Apply topically 3 (three) times a week.    Dispense:  12 each    Refill:  1    Order Specific Question:   Supervising Provider    Answer:   Danise Edge A [id number]

## 2023-01-12 NOTE — Assessment & Plan Note (Signed)
Resolved

## 2023-01-12 NOTE — Assessment & Plan Note (Signed)
New.  Recommended that she begin topical aldara cream 3x a week.

## 2023-01-13 ENCOUNTER — Other Ambulatory Visit: Payer: Self-pay | Admitting: Family

## 2023-01-13 ENCOUNTER — Telehealth: Payer: Self-pay | Admitting: Family

## 2023-01-13 LAB — CERVICOVAGINAL ANCILLARY ONLY
Bacterial Vaginitis (gardnerella): POSITIVE — AB
Chlamydia: NEGATIVE
Comment: NEGATIVE
Comment: NEGATIVE
Comment: NEGATIVE
Comment: NORMAL
Neisseria Gonorrhea: NEGATIVE
Trichomonas: NEGATIVE

## 2023-01-13 MED ORDER — METRONIDAZOLE 0.75 % VA GEL: 1.0000 | Freq: Every day | VAGINAL | 0 refills | Status: AC

## 2023-01-13 NOTE — Telephone Encounter (Signed)
Swab is negative for gonorrhea/chlamydia and trichomonas. She does have bacterial vaginosis. I will send metrogel to her pharmacy.

## 2023-01-13 NOTE — Telephone Encounter (Signed)
Patient advised of results and rx

## 2023-02-10 ENCOUNTER — Encounter: Payer: Self-pay | Admitting: Family

## 2023-02-13 ENCOUNTER — Ambulatory Visit: Payer: Managed Care, Other (non HMO) | Admitting: Family

## 2023-02-17 ENCOUNTER — Ambulatory Visit (INDEPENDENT_AMBULATORY_CARE_PROVIDER_SITE_OTHER): Payer: Managed Care, Other (non HMO) | Admitting: Sports Medicine

## 2023-02-17 ENCOUNTER — Other Ambulatory Visit: Payer: Self-pay

## 2023-02-17 ENCOUNTER — Ambulatory Visit
Admission: RE | Admit: 2023-02-17 | Discharge: 2023-02-17 | Disposition: A | Discharge status: Home / Self Care | Payer: Managed Care, Other (non HMO) | Source: Ambulatory Visit | Attending: Sports Medicine | Admitting: Sports Medicine

## 2023-02-17 VITALS — BP 120/74 | Ht 65.0 in | Wt 190.0 lb

## 2023-02-17 DIAGNOSIS — S93492A Sprain of other ligament of left ankle, initial encounter: Secondary | ICD-10-CM | POA: Diagnosis not present

## 2023-02-17 DIAGNOSIS — M25572 Pain in left ankle and joints of left foot: Secondary | ICD-10-CM

## 2023-02-17 NOTE — Progress Notes (Unsigned)
  Anne [name] - 22 y.o. female MRN [id number]  Date of birth: 11/03/2000    CHIEF COMPLAINT:   left ankle pain    SUBJECTIVE:   HPI: Anne [name] is a 22 yo F with no significant pmh who presents with left ankle pain. The patient reports inverting her ankle this morning while doing an exercise class and stepping off of a box. She says she felt a pop in the ankle followed by pain and swelling. Anne [name] says she has not been able to bear weight on the ankle since this incident and reports feeling the majority of the pain on the lateral aspect of the ankle. She was able to get X-Ray imaging of the ankle before coming into clinic today which showed no acute fracture.    ROS:     See HPI  PERTINENT  PMH / PSH FH / / SH:  Past Medical, Surgical, Social, and Family History Reviewed & Updated in the EMR.  Pertinent findings include:  none  OBJECTIVE: BP 120/74   Ht 5\' 5"  (1.651 m)   Wt 190 lb (86.2 kg)   BMI 31.62 kg/m   Physical Exam:  Vital signs are reviewed.  GEN: Alert and oriented, NAD Pulm: Breathing unlabored PSY: normal mood, congruent affect  MSK: Left ankle - soft tissue swelling present on lateral aspect of the ankle. Tender to palpation on posterior lateral malleolus and base of 5th metatarsal. Mldly tender to palpation on medial malleolus. Non tender to palpation on navicular. Anterior drawer and talar tilt test limited due to swelling.   ASSESSMENT & PLAN:  1. Left ankle Pain The most likely etiology of this patient's clinical presentation is a sprain of the left ankle. X-Ray imaging is reassuring that she didn't suffer a fracture of the foot or ankle. At her follow up visit we will benefit from repeating the anterior drawer and talar tilt test when she has less swelling in the ankle. Patient will benefit from working on range of motion early in the recovery process to help minimize swelling and pain. We also recommend that she elevate the ankle above the level of the  heart to decrease swelling.  - Will give patient a lace-up ankle brace - recommend patient use crutches, ibuprofen, elevate the leg and apply ice to the site of injury.  - Range of motion exercises were given to the patient - Follow up in 2 wks. Will consider increasing physical therapy intensity at that time.   Eustaquio Boyden, MS4

## 2023-02-19 ENCOUNTER — Encounter: Payer: Self-pay | Admitting: Sports Medicine

## 2023-02-20 ENCOUNTER — Encounter (INDEPENDENT_AMBULATORY_CARE_PROVIDER_SITE_OTHER): Payer: Managed Care, Other (non HMO) | Admitting: Family

## 2023-02-20 DIAGNOSIS — N3000 Acute cystitis without hematuria: Secondary | ICD-10-CM

## 2023-02-20 MED ORDER — NITROFURANTOIN MONOHYD MACRO 100 MG PO CAPS: 100.0000 mg | ORAL_CAPSULE | Freq: Two times a day (BID) | ORAL | 0 refills | Status: AC

## 2023-02-20 NOTE — Telephone Encounter (Signed)

## 2023-03-03 ENCOUNTER — Ambulatory Visit (INDEPENDENT_AMBULATORY_CARE_PROVIDER_SITE_OTHER): Payer: Managed Care, Other (non HMO) | Admitting: Sports Medicine

## 2023-03-03 VITALS — BP 110/70 | Ht 65.0 in | Wt 190.0 lb

## 2023-03-03 DIAGNOSIS — S93402A Sprain of unspecified ligament of left ankle, initial encounter: Secondary | ICD-10-CM | POA: Insufficient documentation

## 2023-03-03 DIAGNOSIS — S93402D Sprain of unspecified ligament of left ankle, subsequent encounter: Secondary | ICD-10-CM | POA: Diagnosis not present

## 2023-03-03 NOTE — Progress Notes (Signed)
PCP: [name], NP  Subjective:   HPI: Patient is a 22 y.o. female here for follow up of left anterior talofibular sprain.   Swelling and pain have improved somewhat. She is using lace up ankle brace, however has pain when wearing shoes with this. 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 She is a able to wear ankle brace with slides without issues. Increased pain of the lateral foot after walking about 30 minutes but otherwise able to walk short distances without issue.  Past Medical History:  Diagnosis Date   Anxiety    Concussion    11/2017,  2017   Headache    Strain of hip flexor 06/08/2019   Vision abnormalities     Current Outpatient Medications on File Prior to Visit  Medication Sig Dispense Refill   DULoxetine (CYMBALTA) 30 MG capsule TAKE 1 CAPSULE BY MOUTH DAILY FOR 1 WEEK, THEN INCREASE TO a 60mg  tab once weekly. 7 capsule 0   DULoxetine (CYMBALTA) 60 MG capsule Take 1 capsule (60 mg total) by mouth daily. 90 capsule 0   imiquimod (ALDARA) 5 % cream Apply topically 3 (three) times a week. 12 each 1   Levonorgestrel-Ethinyl Estradiol (AMETHIA,CAMRESE) 0.1-0.02 & 0.01 MG tablet Take 1 tablet by mouth daily.  3   valACYclovir (VALTREX) 500 MG tablet Take 1 tablet (500 mg total) by mouth daily. 90 tablet 1   No current facility-administered medications on file prior to visit.    Past Surgical History:  Procedure Laterality Date   EYE SURGERY     MOUTH SURGERY     wisdom tooth extraction age 14   STRABISMUS SURGERY Right 10/08/2018   Procedure: RIGHT EYE STRABISMUS REPAIR PEDIATRIC;  Surgeon: Verne Carrow, MD;  Location: [facility];  Service: Ophthalmology;  Laterality: Right;   TONSILECTOMY, ADENOIDECTOMY, BILATERAL MYRINGOTOMY AND TUBES      Allergies  Allergen Reactions   Sulfa Antibiotics Hives and Rash    BP 110/70   Ht 5\' 5"  (1.651 m)   Wt 190 lb (86.2 kg)   BMI 31.62 kg/m       No data to display              No data to  display              Objective:  Physical Exam:  Gen: NAD, comfortable in exam room  Left ankle Mild swelling and TTP below the lateral malleolus with mild overlying warmth.  FROM on the ankle with pain in inversion  Increased laxity on anterior drawer test on L, negative talar tilt test  Negative squeeze test, negative external rotation test   .    Assessment & Plan:  1. Left anterior talofibular sprain-Xrays negative for fracture. Overall improving pain and swelling. Walking well with ankle lace up brace. Avoid using boot and encourage to continue ankle lace up boot with slides and shoes as tolerated for 2-2 months. Continue home exervises given ankle strengthening exercises. Continue icing ankle until swelling improves. Follow up in 4 weeks with continues to have pain.   I observed and examined the patient with the resident and agree with assessment and plan.  Note reviewed and modified by me. Sterling Big, MD

## 2023-03-03 NOTE — Assessment & Plan Note (Signed)
Good progress with less swelling Still with pain Walking gait however looks good with not much limp and pretty good balance  Given progressive home exercises today to include balance and standing reach Continue using the lace up brace for a full 3 months Transition out of the boot Recheck if almost all symptoms have not resolved in 4 weeks I would not restart sports until pain is minimal and she feels like she has full motion

## 2023-03-23 ENCOUNTER — Ambulatory Visit: Payer: Managed Care, Other (non HMO) | Admitting: Family

## 2023-03-23 ENCOUNTER — Encounter: Payer: Self-pay | Admitting: Family

## 2023-03-26 ENCOUNTER — Ambulatory Visit: Payer: Managed Care, Other (non HMO) | Admitting: Sports Medicine

## 2023-04-16 ENCOUNTER — Encounter: Payer: Self-pay | Admitting: Family

## 2023-04-16 DIAGNOSIS — N390 Urinary tract infection, site not specified: Secondary | ICD-10-CM

## 2023-04-21 NOTE — Telephone Encounter (Signed)
I would recommend that she come in fur urine culture please either tonight or first thing tomorrow AM and then we can start antibiotics.  The AZO only helps with the symptoms, it does not cure the UTI.

## 2023-04-22 ENCOUNTER — Other Ambulatory Visit (INDEPENDENT_AMBULATORY_CARE_PROVIDER_SITE_OTHER): Payer: Managed Care, Other (non HMO)

## 2023-04-22 ENCOUNTER — Other Ambulatory Visit: Payer: Managed Care, Other (non HMO)

## 2023-04-22 DIAGNOSIS — N390 Urinary tract infection, site not specified: Secondary | ICD-10-CM | POA: Diagnosis not present

## 2023-04-22 NOTE — Telephone Encounter (Signed)
Left detailed message on cell phone advising pt to come to the lab today to provide urine specimen at 10:45 am and the we can begin an antibiotic.

## 2023-04-22 NOTE — Telephone Encounter (Signed)
Patient will be here today for ua check and culture

## 2023-04-23 ENCOUNTER — Telehealth: Payer: Self-pay | Admitting: Family

## 2023-04-23 MED ORDER — CEPHALEXIN 500 MG PO CAPS: 500.0000 mg | ORAL_CAPSULE | Freq: Two times a day (BID) | ORAL | 0 refills | Status: DC

## 2023-04-23 NOTE — Telephone Encounter (Signed)
Urine culture results are still pending but I would like for her to start keflex while we wait which I sent to her pharmacy.

## 2023-04-24 NOTE — Telephone Encounter (Signed)
Patient made aware of this message

## 2023-05-13 ENCOUNTER — Encounter: Payer: Self-pay | Admitting: Urology

## 2023-05-13 ENCOUNTER — Ambulatory Visit: Payer: Managed Care, Other (non HMO) | Admitting: Urology

## 2023-05-13 VITALS — BP 114/76 | HR 103 | Ht 65.0 in | Wt 182.0 lb

## 2023-05-13 DIAGNOSIS — R350 Frequency of micturition: Secondary | ICD-10-CM

## 2023-05-13 DIAGNOSIS — R399 Unspecified symptoms and signs involving the genitourinary system: Secondary | ICD-10-CM

## 2023-05-13 NOTE — Progress Notes (Signed)
   Assessment: 1. Urinary frequency      Plan: Discussed healthy bladder habits including increasing water intake and not holding urine excessively.   Patient will let us know if she feels like she is having UTI symptoms. FU prn  Chief Complaint: ? UTI  History of Present Illness:  Anne [name] is a 22 y.o. female who is seen in consultation from [name], NP for evaluation of recurrent UTI. Interestingly, there is no UA or culture documentation of UTI.   UA today negative Patient had episodes of freq/urgency and dysuria on several occasions and UA and culture neg. She admits to not drinking much water and often holds her urine excessively at work. No other prior GU history   Past Medical History:  Past Medical History:  Diagnosis Date   Anxiety    Concussion    11/2017,  2017   Headache    Strain of hip flexor 06/08/2019   Vision abnormalities     Past Surgical History:  Past Surgical History:  Procedure Laterality Date   EYE SURGERY     MOUTH SURGERY     wisdom tooth extraction age 46   STRABISMUS SURGERY Right 10/08/2018   Procedure: RIGHT EYE STRABISMUS REPAIR PEDIATRIC;  Surgeon: Verne Carrow, MD;  Location: [facility];  Service: Ophthalmology;  Laterality: Right;   TONSILECTOMY, ADENOIDECTOMY, BILATERAL MYRINGOTOMY AND TUBES      Allergies:  Allergies  Allergen Reactions   Sulfa Antibiotics Hives and Rash    Family History:  Family History  Problem Relation Age of Onset   Epilepsy Mother    Diabetes Paternal Grandmother    Alzheimer's disease Paternal Grandfather    Sudden death Neg Hx    Hypertension Neg Hx    Hyperlipidemia Neg Hx    Heart attack Neg Hx     Social History:  Social History   Tobacco Use   Smoking status: Never   Smokeless tobacco: Never  Vaping Use   Vaping status: Never Used  Substance Use Topics   Alcohol use: No    Alcohol/week: 0.0 standard drinks of alcohol   Drug use: No    Review  of symptoms:  Constitutional:  Negative for unexplained weight loss, night sweats, fever, chills ENT:  Negative for nose bleeds, sinus pain, painful swallowing CV:  Negative for chest pain, shortness of breath, exercise intolerance, palpitations, loss of consciousness Resp:  Negative for cough, wheezing, shortness of breath GI:  Negative for nausea, vomiting, diarrhea, bloody stools GU:  Positives noted in HPI; otherwise negative for gross hematuria, dysuria, urinary incontinence Neuro:  Negative for seizures, poor balance, limb weakness, slurred speech Psych:  Negative for lack of energy, depression, anxiety Endocrine:  Negative for polydipsia, polyuria, symptoms of hypoglycemia (dizziness, hunger, sweating) Hematologic:  Negative for anemia, purpura, petechia, prolonged or excessive bleeding, use of anticoagulants  Allergic:  Negative for difficulty breathing or choking as a result of exposure to anything; no shellfish allergy; no allergic response (rash/itch) to materials, foods  Physical exam: BP 114/76   Pulse (!) 103   Ht 5\' 5"  (1.651 m)   Wt 182 lb (82.6 kg)   BMI 30.29 kg/m  GENERAL APPEARANCE:  Well appearing, well developed, well nourished, NAD   Results: UA neg

## 2023-05-22 LAB — URINALYSIS, ROUTINE W REFLEX MICROSCOPIC
Bilirubin, UA: NEGATIVE
Glucose, UA: NEGATIVE
Ketones, UA: NEGATIVE
Nitrite, UA: NEGATIVE
Protein,UA: NEGATIVE
Specific Gravity, UA: 1.01 (ref 1.005–1.030)
Urobilinogen, Ur: 0.2 mg/dL (ref 0.2–1.0)
pH, UA: 6 (ref 5.0–7.5)

## 2023-05-22 LAB — MICROSCOPIC EXAMINATION

## 2023-06-14 ENCOUNTER — Other Ambulatory Visit: Payer: Self-pay | Admitting: Family

## 2023-08-20 ENCOUNTER — Encounter: Payer: Self-pay | Admitting: Family

## 2023-08-20 ENCOUNTER — Encounter: Payer: Self-pay | Admitting: Urology

## 2023-08-20 NOTE — Telephone Encounter (Signed)
Ct found on care everywhere, patient had 2, 2 to 3 mm stones in the ureter and she did pass "2 pieces on Saturday". She was seen by urology in [place]. Only concern is still some fever, has a rx for Levaquin. Advised patient to take and follow up if symptoms do not improve.  Please advise if follow up needed, she will also follow up with urology.

## 2023-08-26 ENCOUNTER — Ambulatory Visit: Payer: Managed Care, Other (non HMO) | Admitting: Urology

## 2023-10-22 ENCOUNTER — Encounter: Payer: Self-pay | Admitting: Family

## 2023-12-02 ENCOUNTER — Encounter: Payer: Self-pay | Admitting: Sports Medicine

## 2023-12-02 ENCOUNTER — Ambulatory Visit (INDEPENDENT_AMBULATORY_CARE_PROVIDER_SITE_OTHER): Admitting: Sports Medicine

## 2023-12-02 VITALS — BP 120/70 | Ht 65.0 in | Wt 182.0 lb

## 2023-12-02 DIAGNOSIS — M25372 Other instability, left ankle: Secondary | ICD-10-CM | POA: Diagnosis not present

## 2023-12-02 NOTE — Progress Notes (Signed)
   Subjective:    Patient ID: Anne [name], female    DOB: [birth year], 23 y.o.   MRN: [id number]  HPI chief complaint: Left ankle pain and instability  Anne [name] presents today with persistent left ankle pain and instability following an inversion injury that she suffered in June of last year.  Over the past 6 weeks her pain has become more constant.  She localizes it to the lateral ankle where she also endorses swelling.  She also continues to invert the ankle with exercise as well as with simple walking.  X-rays done at the time of her injury showed no obvious fracture.  She was given a med spec brace and completed at home exercise program.  Although she did notice some improvement with this her ankle has never felt quite right.  She takes 400 mg of ibuprofen as needed which is minimally beneficial.  Interim medical history reviewed Medications reviewed Allergies reviewed   Review of Systems As above    Objective:   Physical Exam  Well-developed, well-nourished.  No acute distress  Left ankle: Mild soft tissue swelling over the lateral malleolus.  She is tender to palpation medially above the ATF and calcaneofibular ligament.  No tenderness along the peroneal tendon.  Good ankle range of motion.  2-3+ talar tilt.  Positive anterior drawer.  No tenderness along the medial ankle.  Neurovascularly intact distally.      Assessment & Plan:   Chronic left ankle instability  Anne [name] will start physical therapy.  I have encouraged her to wear her med spec brace when working as well as with exercise.  Follow-up with me again in 4 weeks.  We did discuss a possible MRI if physical therapy is ineffective.  We also discussed increasing her dose of ibuprofen from 400 mg to 600 mg as needed.  This note was dictated using Dragon naturally speaking software and may contain errors in syntax, spelling, or content which have not been identified prior to signing this note.

## 2023-12-03 ENCOUNTER — Ambulatory Visit: Admitting: Sports Medicine

## 2023-12-21 ENCOUNTER — Ambulatory Visit: Attending: Sports Medicine | Admitting: Physical Therapy

## 2023-12-22 ENCOUNTER — Encounter: Payer: Self-pay | Admitting: Sports Medicine

## 2023-12-22 ENCOUNTER — Ambulatory Visit: Admitting: Sports Medicine

## 2023-12-22 VITALS — BP 110/62 | Ht 65.0 in | Wt 182.0 lb

## 2023-12-22 DIAGNOSIS — S8001XA Contusion of right knee, initial encounter: Secondary | ICD-10-CM | POA: Diagnosis not present
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
## 2023-12-22 NOTE — Patient Instructions (Addendum)
 You have a hematoma on your knee.  Where your compression wrap during the day, but not at night.  Follow up with me on 12/31/23 as scheduled.

## 2023-12-24 ENCOUNTER — Encounter: Payer: Self-pay | Admitting: Sports Medicine

## 2023-12-31 ENCOUNTER — Ambulatory Visit: Admitting: Sports Medicine

## 2023-12-31 ENCOUNTER — Encounter: Payer: Self-pay | Admitting: Sports Medicine

## 2023-12-31 VITALS — BP 120/82 | Ht 65.0 in | Wt 182.0 lb

## 2023-12-31 DIAGNOSIS — S8001XA Contusion of right knee, initial encounter: Secondary | ICD-10-CM

## 2023-12-31 NOTE — Progress Notes (Signed)
   Subjective:    Patient ID: Anne [name], female    DOB: 2001-04-30, 23 y.o.   MRN: [id number]  HPI [name] presents today for follow-up on a right leg hematoma.  Pain is improving.  She is ready to return to work.  She has been compliant with her Ace wrap.   Review of Systems     Objective:   Physical Exam  Right leg: The area of hematoma in the medial knee has decreased some in size.  There are areas of induration that are tender to palpation.  Minimal ecchymosis.  No swelling distally.  MSK ultrasound of the right lower leg still shows a mix of hypoechoic, hyperechoic, and a few areas of anechoic change consistent with hematoma.      Assessment & Plan:   Right leg hematoma  Patient is 11 days status post injury.  Ultrasound findings are still consistent with hematoma.  At this point in time I do not suspect a Morel Lavalle lesion.  [name] will continue with compression.  I recommended that she order a knee compression sleeve and use that in lieu of an Ace wrap if it is not too uncomfortable.  I think she is okay to return to work using pain as her guide.  Follow-up with me again in 2 weeks.  She will let me know if she has any questions or concerns in the interim.  This note was dictated using Dragon naturally speaking software and may contain errors in syntax, spelling, or content which have not been identified prior to signing this note.

## 2024-01-14 ENCOUNTER — Encounter: Payer: Self-pay | Admitting: Sports Medicine

## 2024-01-14 ENCOUNTER — Ambulatory Visit: Admitting: Sports Medicine

## 2024-01-14 VITALS — BP 114/78 | Ht 65.0 in | Wt 182.0 lb

## 2024-01-14 DIAGNOSIS — S8001XA Contusion of right knee, initial encounter: Secondary | ICD-10-CM | POA: Diagnosis not present

## 2024-01-14 MED ORDER — IBUPROFEN 800 MG PO TABS: 800.0000 mg | ORAL_TABLET | Freq: Three times a day (TID) | ORAL | 0 refills | Status: DC | PRN

## 2024-01-14 NOTE — Addendum Note (Signed)
 Addended by: Claybon Cuna on: 01/14/2024 08:53 AM   Modules accepted: Orders

## 2024-01-14 NOTE — Progress Notes (Signed)
   Subjective:    Patient ID: Anne [name], female    DOB: [birth year], 23 y.o.   MRN: [id number]  HPI  [name] presents today for follow-up on her right knee hematoma.  Hematoma is decreasing in size.  She has had a couple of episodes of feeling that hematoma shift in her knee.  This does cause her pain.  She has been compliant with her compression sleeve.  Her injury was 3 weeks ago.   Review of Systems     Objective:   Physical Exam  Well-developed, well-nourished.  No acute distress  Right knee: Hematoma still present along the medial knee but it has decreased in size over the past 2 weeks.  Slight tenderness to palpation.  No erythema.  Limited knee range of motion secondary to the hematoma but no obvious effusion.  Neurovascularly intact distally.  Bedside ultrasound of the hematoma does show a decrease in size      Assessment & Plan:   Resolving right knee hematoma  Anne [name] will continue with her compression sleeve with activity.  800 mg ibuprofen  3 times a day as needed for pain.  Follow-up again in 3 weeks.  Call with questions or concerns in the interim.  This note was dictated using Dragon naturally speaking software and may contain errors in syntax, spelling, or content which have not been identified prior to signing this note.

## 2024-01-15 ENCOUNTER — Ambulatory Visit: Admitting: Family

## 2024-01-15 ENCOUNTER — Telehealth: Payer: Self-pay | Admitting: Family

## 2024-01-15 NOTE — Telephone Encounter (Signed)
 Could you please send a no show letter to this patient?

## 2024-01-15 NOTE — Telephone Encounter (Signed)
Letter will be mailed out to patient

## 2024-01-26 ENCOUNTER — Ambulatory Visit: Admitting: Family

## 2024-01-28 ENCOUNTER — Other Ambulatory Visit: Payer: Self-pay | Admitting: Family

## 2024-02-01 ENCOUNTER — Ambulatory Visit: Admitting: Physical Therapy

## 2024-02-01 ENCOUNTER — Encounter: Payer: Self-pay | Admitting: Sports Medicine

## 2024-02-01 NOTE — Therapy (Deleted)
 OUTPATIENT PHYSICAL THERAPY LOWER EXTREMITY EVALUATION   Patient Name: Anne [name] MRN: [id number] DOB:20-Nov-2000, 23 y.o., female Today's Date: 02/01/2024  END OF SESSION:   Past Medical History:  Diagnosis Date   Anxiety    Concussion    11/2017,  2017   Headache    Strain of hip flexor 06/08/2019   Vision abnormalities    Past Surgical History:  Procedure Laterality Date   EYE SURGERY     MOUTH SURGERY     wisdom tooth extraction age 63   STRABISMUS SURGERY Right 10/08/2018   Procedure: RIGHT EYE STRABISMUS REPAIR PEDIATRIC;  Surgeon: Dorothey Gate, MD;  Location: [facility];  Service: Ophthalmology;  Laterality: Right;   TONSILECTOMY, ADENOIDECTOMY, BILATERAL MYRINGOTOMY AND TUBES     Patient Active Problem List   Diagnosis Date Noted   Severe sprain of left ankle 03/03/2023   Genital warts 01/12/2023   Viral warts 01/12/2023   Urinary frequency 01/12/2023   UTI (urinary tract infection) 01/06/2023   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  Rationale for Evaluation and Treatment: Rehabilitation  ONSET DATE: ***  SUBJECTIVE:   SUBJECTIVE STATEMENT: ***  PERTINENT HISTORY: "persistent left ankle pain and instability following an inversion injury that she suffered in June of last year. Over the past 6 weeks her pain has become more constant. She localizes it to the lateral ankle where she also endorses swelling. She also continues to invert the ankle with exercise as well as with simple walking. "  PAIN:  Are you having pain?  {OPRCPAIN:27236}  PRECAUTIONS: {Therapy precautions:24002}  RED FLAGS: {PT Red Flags:29287}   WEIGHT BEARING RESTRICTIONS: {Yes ***/No:24003}  FALLS:  Has patient fallen in last 6 months? {fallsyesno:27318}  LIVING ENVIRONMENT: Lives with: {OPRC lives with:25569::"lives with their family"} Lives in: {Lives in:25570} Stairs: {opstairs:27293} Has following equipment at home: {Assistive devices:23999}  OCCUPATION: ***  PLOF: {PLOF:24004}  PATIENT GOALS: ***  NEXT MD VISIT: ***  OBJECTIVE:  Note: Objective measures were completed at Evaluation unless otherwise noted.  DIAGNOSTIC FINDINGS: ***  PATIENT SURVEYS:  {rehab surveys:24030}  COGNITION: Overall cognitive status: {cognition:24006}     SENSATION: {sensation:27233}  EDEMA:  {edema:24020}  MUSCLE LENGTH: Hamstrings: Right *** deg; Left *** deg Andy Bannister test: Right *** deg; Left *** deg  POSTURE: {posture:25561}  PALPATION: ***  LOWER EXTREMITY ROM:  {AROM/PROM:27142} ROM Right eval Left eval  Hip flexion    Hip extension    Hip abduction    Hip adduction    Hip internal rotation    Hip external rotation    Knee flexion    Knee extension    Ankle dorsiflexion    Ankle plantarflexion    Ankle inversion    Ankle eversion     (Blank rows = not tested)  LOWER EXTREMITY MMT:  MMT Right eval Left eval  Hip flexion    Hip extension    Hip abduction    Hip adduction    Hip internal rotation    Hip external rotation    Knee flexion    Knee extension    Ankle dorsiflexion    Ankle  plantarflexion    Ankle inversion    Ankle eversion     (Blank rows = not tested)  LOWER EXTREMITY SPECIAL TESTS:  {LEspecialtests:26242}  FUNCTIONAL TESTS:  {Functional tests:24029}  GAIT: Distance walked: *** Assistive device utilized: {Assistive devices:23999} Level of assistance: {Levels of assistance:24026} Comments: ***                                                                                                                                 TREATMENT DATE: ***    PATIENT EDUCATION:  Education details: *** Person educated: {Person educated:25204} Education method: {Education Method:25205} Education comprehension: {Education Comprehension:25206}  HOME EXERCISE PROGRAM: ***  ASSESSMENT:  CLINICAL IMPRESSION: Patient is a *** y.o. *** who was seen today for physical therapy evaluation and treatment for ***.   OBJECTIVE IMPAIRMENTS: {opptimpairments:25111}.   ACTIVITY LIMITATIONS: {activitylimitations:27494}  PARTICIPATION LIMITATIONS: {participationrestrictions:25113}  PERSONAL FACTORS: {Personal factors:25162} are also affecting patient's functional outcome.   REHAB POTENTIAL: {rehabpotential:25112}  CLINICAL DECISION MAKING: {clinical decision making:25114}  EVALUATION COMPLEXITY: {Evaluation complexity:25115}   GOALS: Goals reviewed with patient? {yes/no:20286}  SHORT TERM GOALS: Target date: *** *** Baseline: Goal status: INITIAL  2.  *** Baseline:  Goal status: INITIAL  3.  *** Baseline:  Goal status: INITIAL  4.  *** Baseline:  Goal status: INITIAL  5.  *** Baseline:  Goal status: INITIAL  6.  *** Baseline:  Goal status: INITIAL  LONG TERM GOALS: Target date: ***  *** Baseline:  Goal status: INITIAL  2.  *** Baseline:  Goal status: INITIAL  3.  *** Baseline:  Goal status: INITIAL  4.  *** Baseline:  Goal status: INITIAL  5.  *** Baseline:  Goal status: INITIAL  6.  *** Baseline:  Goal status: INITIAL   PLAN:  PT FREQUENCY: {rehab frequency:25116}  PT DURATION: {rehab duration:25117}  PLANNED INTERVENTIONS: {rehab planned interventions:25118::"97110-Therapeutic exercises","97530- Therapeutic [phone]- Neuromuscular re-education","97535- Self JXBJ","47829- Manual therapy"}  PLAN FOR NEXT SESSION: ***   Sharde Gover April Ma L Ellsie Violette, PT 02/01/2024, 7:56 AM

## 2024-02-04 ENCOUNTER — Ambulatory Visit: Admitting: Sports Medicine

## 2024-02-04 ENCOUNTER — Encounter: Payer: Self-pay | Admitting: Family

## 2024-02-05 ENCOUNTER — Ambulatory Visit: Admitting: Family

## 2024-02-05 NOTE — Progress Notes (Deleted)
 Subjective:     Patient ID: Anne [name], female    DOB: Sep 28, 2000, 23 y.o.   MRN: [id number]  No chief complaint on file.   HPI  Discussed the use of AI scribe software for clinical note transcription with the patient, who gave verbal consent to proceed.  History of Present Illness       Health Maintenance Due  Topic Date Due   Pneumococcal Vaccine 2-45 Years old (1 of 2 - PPSV23) 08/05/2002   HIV Screening  Never done   Meningococcal B Vaccine (1 of 2 - Standard) Never done   Hepatitis C Screening  Never done   COVID-19 Vaccine (1 - 2024-25 season) Never done   CHLAMYDIA SCREENING  01/12/2024    Past Medical History:  Diagnosis Date   Anxiety    Concussion    11/2017,  2017   Headache    Strain of hip flexor 06/08/2019   Vision abnormalities     Past Surgical History:  Procedure Laterality Date   EYE SURGERY     MOUTH SURGERY     wisdom tooth extraction age 51   STRABISMUS SURGERY Right 10/08/2018   Procedure: RIGHT EYE STRABISMUS REPAIR PEDIATRIC;  Surgeon: Dorothey Gate, MD;  Location: [facility];  Service: Ophthalmology;  Laterality: Right;   TONSILECTOMY, ADENOIDECTOMY, BILATERAL MYRINGOTOMY AND TUBES      Family History  Problem Relation Age of Onset   Epilepsy Mother    Diabetes Paternal Grandmother    Alzheimer's disease Paternal Grandfather    Sudden death Neg Hx    Hypertension Neg Hx    Hyperlipidemia Neg Hx    Heart attack Neg Hx     Social History   Socioeconomic History   Marital status: Single    Spouse name: Not on file   Number of children: Not on file   Years of education: Not on file   Highest education level: Not on file  Occupational History   Not on file  Tobacco Use   Smoking status: Never   Smokeless tobacco: Never  Vaping Use   Vaping status: Never Used  Substance and Sexual Activity   Alcohol use: No    Alcohol/week: 0.0 standard drinks of alcohol   Drug use: No   Sexual activity: Yes     Partners: Male    Birth control/protection: Pill  Other Topics Concern   Not on file  Social History Narrative   Graduated from [employer], she is job Pharmacist, 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   She has a cat and a dog.    Social Drivers of Corporate investment banker Strain: Not on file  Food Insecurity: Not on file  Transportation Needs: Not on file  Physical Activity: Sufficiently Active (10/22/2022)   Exercise Vital Sign    Days of Exercise per Week: 4 days    Minutes of Exercise per Session: 50 min  Stress: Not on file  Social Connections: Unknown (01/24/2022)   Received from [facility]   Social Network    Social Network: Not on file  Intimate Partner Violence: Not At Risk (12/20/2023)   Received from [facility]   HITS    Over the last 12 months how often did your partner physically hurt you?: Never    Over the last 12 months how often did your partner insult you or talk down to you?: Never  Over the last 12 months how often did your partner threaten you with physical harm?: Never    Over the last 12 months how often did your partner scream or curse at you?: Never    Outpatient Medications Prior to Visit  Medication Sig Dispense Refill   DULoxetine  (CYMBALTA ) 30 MG capsule TAKE 1 CAPSULE BY MOUTH DAILY FOR 1 WEEK, THEN INCREASE TO a 60mg  tab once weekly. 7 capsule 0   DULoxetine  (CYMBALTA ) 60 MG capsule Take 1 capsule (60 mg total) by mouth daily. 90 capsule 0   ibuprofen  (ADVIL ) 800 MG tablet Take 1 tablet (800 mg total) by mouth 3 (three) times daily as needed. 30 tablet 0   imiquimod  (ALDARA ) 5 % cream Apply topically 3 (three) times a week. (Patient not taking: Reported on 05/13/2023) 12 each 1   Levonorgestrel-Ethinyl Estradiol (AMETHIA,CAMRESE) 0.1-0.02 & 0.01 MG tablet Take 1 tablet by mouth daily.  3   valACYclovir  (VALTREX ) 500 MG tablet TAKE 1 TABLET (500 MG TOTAL) BY MOUTH  DAILY. 90 tablet 1   No facility-administered medications prior to visit.    Allergies  Allergen Reactions   Sulfa Antibiotics Hives and Rash    ROS     Objective:     Physical Exam   There were no vitals taken for this visit. Wt Readings from Last 3 Encounters:  01/14/24 182 lb (82.6 kg)  12/31/23 182 lb (82.6 kg)  12/22/23 182 lb (82.6 kg)       Assessment & Plan:   Problem List Items Addressed This Visit   None   I am having Anne [name] maintain her Levonorgestrel-Ethinyl Estradiol, DULoxetine , imiquimod , DULoxetine , valACYclovir , and ibuprofen .  No orders of the defined types were placed in this encounter.

## 2024-02-16 ENCOUNTER — Telehealth: Payer: Self-pay

## 2024-02-16 NOTE — Telephone Encounter (Signed)
 Call Type Message Only Information Provided Reason for Call Request to Schedule Office Appointment Initial Comment Caller states she would like to schedule and appt with Dorrene Gaucher NP. Patient request to speak to RN No Additional Comment Office hours provided. Declined triage. Translation No Disp. Time [facility] Time) Disposition Final User 02/15/2024 6:08:07 PM General Information Provided Yes [name], Anne Alf Final Disposition 02/15/2024 6:08:07 PM General Information Provided Yes [name], Anne Alf

## 2024-02-16 NOTE — Telephone Encounter (Signed)
 Patient will be added to Melissa's schedule next week for weight management

## 2024-02-18 ENCOUNTER — Encounter: Payer: Self-pay | Admitting: Sports Medicine

## 2024-02-19 ENCOUNTER — Other Ambulatory Visit: Payer: Self-pay

## 2024-02-19 ENCOUNTER — Ambulatory Visit: Admitting: Family Medicine

## 2024-02-19 VITALS — BP 120/65 | Ht 65.0 in | Wt 185.0 lb

## 2024-02-19 DIAGNOSIS — M25561 Pain in right knee: Secondary | ICD-10-CM

## 2024-02-19 NOTE — Therapy (Incomplete)
 OUTPATIENT PHYSICAL THERAPY LOWER EXTREMITY EVALUATION   Patient Name: Anne [name] MRN: [id number] DOB:[birth year], 23 y.o., female Today's Date: 02/19/2024   END OF SESSION:   Past Medical History:  Diagnosis Date   Anxiety    Concussion    11/2017,  2017   Headache    Strain of hip flexor 06/08/2019   Vision abnormalities    Past Surgical History:  Procedure Laterality Date   EYE SURGERY     MOUTH SURGERY     wisdom tooth extraction age 72   STRABISMUS SURGERY Right 10/08/2018   Procedure: RIGHT EYE STRABISMUS REPAIR PEDIATRIC;  Surgeon: Dorothey Gate, MD;  Location: [facility];  Service: Ophthalmology;  Laterality: Right;   TONSILECTOMY, ADENOIDECTOMY, BILATERAL MYRINGOTOMY AND TUBES     Patient Active Problem List   Diagnosis Date Noted   Severe sprain of left ankle 03/03/2023   Genital warts 01/12/2023   Viral warts 01/12/2023   Urinary frequency 01/12/2023   UTI (urinary tract infection) 01/06/2023   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  RATIONALE FOR EVALUATION AND TREATMENT: Rehabilitation  ONSET DATE: ***June 2024  NEXT MD VISIT: ***None scheduled   SUBJECTIVE:                                                                                                                                                                                                         SUBJECTIVE STATEMENT: ***  ***"persistent left ankle pain and instability following an inversion injury that she suffered in June of last year. Over the past 6 weeks her pain has become  more constant. She localizes it to the lateral ankle where she also endorses swelling. She also continues to invert the ankle with exercise as well as with simple walking. "***  PAIN: Are you having pain? {OPRCPAIN:27236}  PERTINENT HISTORY:  ***Severe sprain of L ankle 03/03/2023, L knee injury 2016, ADHD anxiety and depression, concussion x 2 (2017 & 2019), headache  PRECAUTIONS: {Therapy precautions:24002}  RED FLAGS: {PT Red Flags:29287}  WEIGHT BEARING RESTRICTIONS: {Yes ***/No:24003}  FALLS:  Has patient fallen in last 6 months? {fallsyesno:27318}  LIVING ENVIRONMENT: Lives with: {OPRC lives with:25569::"lives with  their family"} Lives in: {Lives in:25570} Stairs: {opstairs:27293} Has following equipment at home: {Assistive devices:23999}  OCCUPATION: ***  PLOF: {PLOF:24004}  PATIENT GOALS: ***   OBJECTIVE: (objective measures completed at initial evaluation unless otherwise dated)  DIAGNOSTIC FINDINGS:  02/17/24 - DG L Ankle and Foot: FINDINGS: Left ankle:  Normal bone mineralization. The ankle mortise is symmetric and intact. Joint spaces are preserved. Relatively decreased density at the superolateral aspect of the talar dome on oblique view is favored to be due to less overlying tibial bone on that view. No definite osteochondral defect is seen on lateral view.   Left foot:  Mild hallux valgus. Joint spaces are preserved. No acute fracture is seen. No dislocation.   IMPRESSION: 1. Mild hallux valgus. 2. No acute fracture.  PATIENT SURVEYS:  LEFS  Extreme difficulty/unable (0), Quite a bit of difficulty (1), Moderate difficulty (2), Little difficulty (3), No difficulty (4) Survey date:    Any of your usual work, housework or school activities   2. Usual hobbies, recreational or sporting activities   3. Getting into/out of the bath   4. Walking between rooms   5. Putting on socks/shoes   6. Squatting    7. Lifting an object, like a bag of groceries from  the floor   8. Performing light activities around your home   9. Performing heavy activities around your home   10. Getting into/out of a car   11. Walking 2 blocks   12. Walking 1 mile   13. Going up/down 10 stairs (1 flight)   14. Standing for 1 hour   15.  sitting for 1 hour   16. Running on even ground   17. Running on uneven ground   18. Making sharp turns while running fast   19. Hopping    20. Rolling over in bed   Score total:  ***   COGNITION: Overall cognitive status: {cognition:24006}    SENSATION: {sensation:27233}  EDEMA:  {edema:24020}  POSTURE:  {posture:25561}  PALPATION: ***  MUSCLE LENGTH: Hamstrings: Right *** deg; Left *** deg Andy Bannister test: Right *** deg; Left *** deg Hamstrings: *** ITB: *** Piriformis: *** Hip flexors: *** Quads: *** Heelcord: ***  LOWER EXTREMITY ROM:  {AROM/PROM:27142} ROM Right eval Left eval  Hip flexion    Hip extension    Hip abduction    Hip adduction    Hip internal rotation    Hip external rotation    Knee flexion    Knee extension    Ankle dorsiflexion    Ankle plantarflexion    Ankle inversion    Ankle eversion     {AROM/PROM:27142} ROM Right eval Left eval  Hip flexion    Hip extension    Hip abduction    Hip adduction    Hip internal rotation    Hip external rotation    Knee flexion    Knee extension    Ankle dorsiflexion    Ankle plantarflexion    Ankle inversion    Ankle eversion    (Blank rows = not tested)  LOWER EXTREMITY MMT:  MMT Right eval Left eval  Hip flexion    Hip extension    Hip abduction    Hip adduction    Hip internal rotation    Hip external rotation    Knee flexion    Knee extension    Ankle dorsiflexion    Ankle plantarflexion    Ankle inversion    Ankle eversion     (Blank  rows = not tested)  LOWER EXTREMITY SPECIAL TESTS:  {LEspecialtests:26242}  FUNCTIONAL TESTS:  {Functional tests:24029}  GAIT: Distance walked: *** Assistive device utilized:  {Assistive devices:23999} Level of assistance: {Levels of assistance:24026} Gait pattern: {gait characteristics:25376} Comments: ***   TODAY'S TREATMENT:   ***   PATIENT EDUCATION:  Education details: {Education details:27468}  Person educated: {Person educated:25204} Education method: {Education Method ZO:10960} Education comprehension: {Education Comprehension:25206}  HOME EXERCISE PROGRAM: ***   ASSESSMENT:  CLINICAL IMPRESSION: Adlee Paar is a 23 y.o. female who was referred to physical therapy for evaluation and treatment for L ankle instability .  ***   Patient reports onset of *** pain beginning ***. Pain is worse with ***.  Patient has deficits in *** ROM, *** LE flexibility, *** strength, ***abnormal posture, and TTP with abnormal muscle tension *** which are interfering with ADLs and are impacting quality of life.  On LEFS patient scored ***/80 demonstrating ***% disability.  Justiss will benefit from skilled PT to address above deficits to improve mobility and activity tolerance with decreased pain interference.  OBJECTIVE IMPAIRMENTS: {opptimpairments:25111}.   ACTIVITY LIMITATIONS: {activitylimitations:27494}  PARTICIPATION LIMITATIONS: {participationrestrictions:25113}  PERSONAL FACTORS: {Personal factors:25162} are also affecting patient's functional outcome.   REHAB POTENTIAL: {rehabpotential:25112}  CLINICAL DECISION MAKING: {clinical decision making:25114}  EVALUATION COMPLEXITY: {Evaluation complexity:25115}   GOALS: Goals reviewed with patient? {yes/no:20286}  SHORT TERM GOALS: Target date: ***  Patient will be independent with initial HEP. Baseline: *** Goal status: {GOALSTATUS:25110}  2.  Patient will report at least 25% improvement in *** ankle/foot pain to improve QOL. Baseline: *** Goal status: {GOALSTATUS:25110}  3.  *** Baseline: *** Goal status: {GOALSTATUS:25110}   LONG TERM GOALS: Target date: ***  Patient will be  independent with advanced/ongoing HEP to improve outcomes and carryover.  Baseline: *** Goal status: {GOALSTATUS:25110}  2.  Patient will report at least 50-75% improvement in *** ankle/foot pain to improve QOL. Baseline: *** Goal status: {GOALSTATUS:25110}  3.  Patient will demonstrate improved *** ankle AROM to {Functional status:27472} to allow for normal gait and stair mechanics. Baseline: Refer to above LE ROM table Goal status: {GOALSTATUS:25110}  4.  Patient will demonstrate improved *** strength to >/= ***/5 for improved stability and ease of mobility. Baseline: Refer to above LE MMT table Goal status: {GOALSTATUS:25110}  5.  Patient will be able to ambulate 600' with normal gait pattern without increased foot/ankle pain or instability to access community.  Baseline: *** Goal status: {GOALSTATUS:25110}  6. Patient will be able to ascend/descend stairs with 1 HR and reciprocal step pattern safely to access home and community.  Baseline: *** Goal status: {GOALSTATUS:25110}  7.  Patient will report >/= ***/80 on LEFS (MCID = 9 pts) to demonstrate improved functional ability. Baseline: *** Goal status: {GOALSTATUS:25110}  8.  Patient will demonstrate at least ***19/24 on DGI to decrease risk of falls. Baseline: *** Goal status: {GOALSTATUS:25110}   9.  *** Baseline: *** Goal status: {GOALSTATUS:25110}   PLAN:  PT FREQUENCY: {rehab frequency:25116}  PT DURATION: {rehab duration:25117}  PLANNED INTERVENTIONS: {rehab planned interventions:25118::"97110-Therapeutic exercises","97530- Therapeutic [id number]- Neuromuscular re-education","97535- Self GNFA","21308- Manual therapy"}  PLAN FOR NEXT SESSION: ***   Francisco Irving, PT 02/19/2024, 9:06 AM

## 2024-02-19 NOTE — Progress Notes (Signed)
 [facility] Attending Note: I have seen and examined this patient with the medical student. I have  performed the history, physical examination, assessment and plan as documented in the medical student's note.  I agree with the medical student's note and findings, assessment and treatment plan as documented with the following additions : Large hematoma on medial right knee with date of injury 12/20/2023.   - reviewed imaging reports from 12/20/2023 [facility], including CT scan of knee.  - Per her report this is gotten worse in the last 4 to 5 days.  - -Ultrasound today reveals large hematoma.  There may be some evidence of rebleeding although this is not definitive.   Long discussion with her.  Since she is having some mechanical symptoms, I wonder if the extremely large hematoma from the initial injury was acting as a natural "splint".  As size of the hematoma improved, at least up until the last week, she was not experiencing mechanical symptoms.  With a general decrease in size of the hematoma, she may not now have that stability.  My concern would be for medial meniscus injury.  - She is very concerned especially since it is not doing as well.  We discussed at length and I think she would benefit from an MRI which we have ordered.    I will follow-up with her after I get the results of that back.  No additional restrictions on activity at this time.  Continue range of motion, conservative management otherwise.  35  minutes total time spent including: patient interview, review of prior imaging reports, examination, review of chart and history, development of differential diagnoses, decision about testing, discussion of treatment and management options with patient and coordination of care.
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
## 2024-02-23 ENCOUNTER — Ambulatory Visit: Admitting: Sports Medicine

## 2024-02-23 ENCOUNTER — Ambulatory Visit: Admitting: Family

## 2024-02-23 ENCOUNTER — Other Ambulatory Visit (HOSPITAL_BASED_OUTPATIENT_CLINIC_OR_DEPARTMENT_OTHER): Payer: Self-pay

## 2024-02-23 VITALS — BP 112/58 | HR 92 | Temp 98.4°F | Resp 16 | Ht 65.0 in | Wt 208.0 lb

## 2024-02-23 DIAGNOSIS — E6609 Other obesity due to excess calories: Secondary | ICD-10-CM | POA: Diagnosis not present

## 2024-02-23 DIAGNOSIS — Z6834 Body mass index (BMI) 34.0-34.9, adult: Secondary | ICD-10-CM

## 2024-02-23 DIAGNOSIS — E66811 Other obesity due to excess calories: Secondary | ICD-10-CM

## 2024-02-23 DIAGNOSIS — F32A Depression, unspecified: Secondary | ICD-10-CM

## 2024-02-23 DIAGNOSIS — F419 Anxiety disorder, unspecified: Secondary | ICD-10-CM

## 2024-02-23 DIAGNOSIS — B079 Viral wart, unspecified: Secondary | ICD-10-CM

## 2024-02-23 MED ORDER — ZEPBOUND 2.5 MG/0.5ML ~~LOC~~ SOAJ
2.5000 mg | SUBCUTANEOUS | 0 refills | Status: DC
Start: 2024-02-23 — End: 2024-07-13
  Filled 2024-02-23: qty 2, 28d supply, fill #0

## 2024-02-23 MED ORDER — DULOXETINE HCL 60 MG PO CPEP: 60.0000 mg | ORAL_CAPSULE | Freq: Every day | ORAL | Status: AC

## 2024-02-23 NOTE — Assessment & Plan Note (Signed)
  Gained 20 pounds in the past year, BMI 34. Discussed calorie tracking and caloric deficit. Reviewed Zepbound risks and benefits, including thyroid cancer risk, side effects, and potential weight regain. - Start calorie tracking to establish baseline intake. - Consider Zepbound if insurance covers. - Continue meal prepping, focus on balanced diet with lean proteins and vegetables. - Limit sugary beverages and high-calorie foods. - Follow-up in one month to assess weight loss and medication.

## 2024-02-23 NOTE — Assessment & Plan Note (Signed)
 Controlled on cymbalta . Continue same.
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
## 2024-02-23 NOTE — Assessment & Plan Note (Signed)
 Reports resolution with aldara .

## 2024-02-24 ENCOUNTER — Encounter: Payer: Self-pay | Admitting: Family

## 2024-02-24 ENCOUNTER — Encounter (HOSPITAL_BASED_OUTPATIENT_CLINIC_OR_DEPARTMENT_OTHER): Payer: Self-pay

## 2024-02-24 ENCOUNTER — Other Ambulatory Visit (HOSPITAL_BASED_OUTPATIENT_CLINIC_OR_DEPARTMENT_OTHER): Payer: Self-pay

## 2024-02-25 ENCOUNTER — Ambulatory Visit: Attending: Sports Medicine | Admitting: Physical Therapy

## 2024-02-25 ENCOUNTER — Other Ambulatory Visit (HOSPITAL_BASED_OUTPATIENT_CLINIC_OR_DEPARTMENT_OTHER): Payer: Self-pay

## 2024-02-25 ENCOUNTER — Encounter (HOSPITAL_BASED_OUTPATIENT_CLINIC_OR_DEPARTMENT_OTHER): Payer: Self-pay

## 2024-02-28 ENCOUNTER — Ambulatory Visit
Admission: RE | Admit: 2024-02-28 | Discharge: 2024-02-28 | Disposition: A | Discharge status: Home / Self Care | Source: Ambulatory Visit | Attending: Family Medicine | Admitting: Family Medicine

## 2024-02-28 DIAGNOSIS — M25561 Pain in right knee: Secondary | ICD-10-CM

## 2024-02-29 ENCOUNTER — Ambulatory Visit: Payer: Self-pay | Admitting: Family Medicine

## 2024-03-07 ENCOUNTER — Other Ambulatory Visit: Payer: Self-pay

## 2024-03-07 ENCOUNTER — Telehealth: Payer: Self-pay

## 2024-03-07 ENCOUNTER — Other Ambulatory Visit (HOSPITAL_BASED_OUTPATIENT_CLINIC_OR_DEPARTMENT_OTHER): Payer: Self-pay

## 2024-03-07 MED ORDER — VALACYCLOVIR HCL 500 MG PO TABS: 500.0000 mg | ORAL_TABLET | Freq: Every day | ORAL | 1 refills | Status: DC

## 2024-03-07 NOTE — Telephone Encounter (Signed)
 Initial Comment Caller states she ran out of her medications and it is supposed to have automatic refills but the pharmacy is stating she is out of refills and she is needing approval for additional refills. Translation No Nurse Assessment Nurse: Roseann, RN, Sherrie Date/Time (Eastern Time): 03/05/2024 12:20:55 PM Confirm and document reason for call. If symptomatic, describe symptoms. ---Caller states is out of Valtrex  500 mg QD. States has been out of medication x 4 days. States is out in sun so then gets fever blisters. None at present. No new or worsening s/s. States seen PCP about 2 weeks ago. Does the patient have any new or worsening symptoms? ---No Please document clinical information provided and list any resource used. ---Instructed to call office on Monday for new Rx. Since now out of refills. [facility] [address] in [place]. [phone] Nurse: Roseann, RN, Sherrie Date/Time (Eastern Time): 03/05/2024 12:28:27 PM You have opened med assessment, do you wish to continue? ---Yes Please select the assessment type ---Refill Additional Documentation ---Pharmacy states out of refills and has attempted to contact office but no reply. Does the patient have enough medication to last until the office opens? ---No Additional Documentation ---Valacyclovir  500mg  QD x 3 tabs original Rx by Eleanor Ponto, NP will be [facility] to [facility] medication until Monday when office opens. PLEASE NOTE: All timestamps contained within this report are represented as [place] Standard Time. CONFIDENTIALTY NOTICE: This fax transmission is intended only for the addressee. It contains information that is legally privileged, confidential or otherwise protected from use or disclosure. If you are not the intended recipient, you are strictly prohibited from reviewing, disclosing, copying using or disseminating any of this information or taking any action in reliance on or  regarding this information. If you have received this fax in error, please notify us  immediately by telephone so that we can arrange for its return to us . Phone: [phone], Toll-Free: [phone], Fax: [phone] EMILY_ROUMILLAT 02-12-2001 Page: 1 of3 CallId: [id number] Disp. Time Titus Time) Disposition Final User 03/05/2024 12:14:21 PM Send To Nurse Acey Muzzy, RN, [facility] 03/05/2024 1:36:54 PM Attempt made - message left Cantrell, RN, Almarie 03/05/2024 1:52:25 PM Send To RN Personal Clois, RN, Almarie 03/05/2024 2:16:30 PM Pharmacy Call Roseann, RN, Sherrie Reason: [facility] @ [phone] and spoke with pharmacist Glade. Given Rx for Valtrex  (generic) 500mg  tab one tab daily. Quantity: 3 No refills. 03/05/2024 2:16:43 PM Clinical Call Yes Roseann, RN, Sherrie Final Disposition 03/05/2024 2:16:43 PM Clinical Call Yes Roseann, RN, Sherrie Verbal Orders/Maintenance Medications Medication Refill Route Dosage Regime Duration Admin Instructions User Name Valacyclovir  500 Oral Take one tab daily Quantity: 3 No refills Roseann, Charity fundraiser, Sherrie Comments User: Julianna Roseann, RN Date/Time (Eastern Time): 03/05/2024 2:15:17 PM Attempted to call in medication to pharmacy x 2 but long wait times of 5 min or >.

## 2024-03-25 ENCOUNTER — Ambulatory Visit: Admitting: Family

## 2024-07-01 LAB — HM PAP SMEAR
HPV 16/18/45 genotyping: NEGATIVE
HPV, high-risk: POSITIVE

## 2024-07-06 LAB — RESULTS CONSOLE HPV: CHL HPV: POSITIVE

## 2024-07-13 ENCOUNTER — Encounter: Payer: Self-pay | Admitting: Family Medicine

## 2024-07-13 ENCOUNTER — Ambulatory Visit: Admitting: Family Medicine

## 2024-07-13 VITALS — BP 121/65 | HR 102 | Ht 65.0 in | Wt 199.0 lb

## 2024-07-13 DIAGNOSIS — R631 Polydipsia: Secondary | ICD-10-CM

## 2024-07-13 DIAGNOSIS — T148XXD Other injury of unspecified body region, subsequent encounter: Secondary | ICD-10-CM

## 2024-07-13 DIAGNOSIS — R5383 Other fatigue: Secondary | ICD-10-CM | POA: Diagnosis not present

## 2024-07-13 DIAGNOSIS — Z131 Encounter for screening for diabetes mellitus: Secondary | ICD-10-CM
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
## 2024-07-15 ENCOUNTER — Other Ambulatory Visit

## 2024-07-20 ENCOUNTER — Other Ambulatory Visit (INDEPENDENT_AMBULATORY_CARE_PROVIDER_SITE_OTHER)

## 2024-07-20 DIAGNOSIS — R631 Polydipsia: Secondary | ICD-10-CM | POA: Diagnosis not present

## 2024-07-20 DIAGNOSIS — T148XXD Other injury of unspecified body region, subsequent encounter: Secondary | ICD-10-CM

## 2024-07-20 DIAGNOSIS — Z131 Encounter for screening for diabetes mellitus: Secondary | ICD-10-CM | POA: Diagnosis not present

## 2024-07-20 DIAGNOSIS — R5383 Other fatigue: Secondary | ICD-10-CM

## 2024-07-20 LAB — COMPREHENSIVE METABOLIC PANEL WITH GFR
ALT: 18 U/L (ref 0–35)
AST: 16 U/L (ref 0–37)
Albumin: 4.6 g/dL (ref 3.5–5.2)
Alkaline Phosphatase: 83 U/L (ref 39–117)
BUN: 17 mg/dL (ref 6–23)
CO2: 32 meq/L (ref 19–32)
Calcium: 9.5 mg/dL (ref 8.4–10.5)
Chloride: 102 meq/L (ref 96–112)
Creatinine, Ser: 0.95 mg/dL (ref 0.40–1.20)
GFR: 84.68 mL/min (ref >60.00)
Glucose, Bld: 84 mg/dL (ref 70–99)
Potassium: 4.3 meq/L (ref 3.5–5.1)
Sodium: 140 meq/L (ref 135–145)
Total Bilirubin: 0.4 mg/dL (ref 0.2–1.2)
Total Protein: 7.6 g/dL (ref 6.0–8.3)

## 2024-07-20 LAB — CBC WITH DIFFERENTIAL/PLATELET
Basophils Absolute: 0 K/uL (ref 0.0–0.1)
Basophils Relative: 0.4 % (ref 0.0–3.0)
Eosinophils Absolute: 0.1 K/uL (ref 0.0–0.7)
Eosinophils Relative: 1.5 % (ref 0.0–5.0)
HCT: 42.6 % (ref 36.0–46.0)
Hemoglobin: 14.2 g/dL (ref 12.0–15.0)
Lymphocytes Relative: 25.2 % (ref 12.0–46.0)
Lymphs Abs: 1.8 K/uL (ref 0.7–4.0)
MCHC: 33.3 g/dL (ref 30.0–36.0)
MCV: 90.2 fl (ref 78.0–100.0)
Monocytes Absolute: 0.6 K/uL (ref 0.1–1.0)
Monocytes Relative: 8.3 % (ref 3.0–12.0)
Neutro Abs: 4.6 K/uL (ref 1.4–7.7)
Neutrophils Relative %: 64.6 % (ref 43.0–77.0)
Platelets: 336 K/uL (ref 150.0–400.0)
RBC: 4.72 Mil/uL (ref 3.87–5.11)
RDW: 13.6 % (ref 11.5–15.5)
WBC: 7.1 K/uL (ref 4.0–10.5)

## 2024-07-20 LAB — HEMOGLOBIN A1C: Hgb A1c MFr Bld: 5.1 % (ref 4.6–6.5)

## 2024-07-21 ENCOUNTER — Ambulatory Visit: Payer: Self-pay | Admitting: Family Medicine

## 2024-07-21 LAB — IBC + FERRITIN
Ferritin: 22.7 ng/mL (ref 10.0–291.0)
Iron: 44 ug/dL (ref 42–145)
Saturation Ratios: 10 % — ABNORMAL LOW (ref 20.0–50.0)
TIBC: 438.2 ug/dL (ref 250.0–450.0)
Transferrin: 313 mg/dL (ref 212.0–360.0)

## 2024-07-21 LAB — TSH: TSH: 2.56 u[IU]/mL (ref 0.35–5.50)

## 2024-07-21 LAB — B12 AND FOLATE PANEL
Folate: 6.7 ng/mL (ref >5.9)
Vitamin B-12: 253 pg/mL (ref 211–911)

## 2024-07-21 LAB — CORTISOL: Cortisol, Plasma: 17.3 ug/dL

## 2024-08-31 ENCOUNTER — Other Ambulatory Visit: Payer: Self-pay | Admitting: Family

## 2024-09-01 ENCOUNTER — Encounter: Payer: Self-pay | Admitting: Family
